# Patient Record
Sex: Female | Born: 1945
Health system: Southern US, Community
[De-identification: ages and names within clinical notes are randomized; demographics above are authoritative.]

## PROBLEM LIST (undated history)

## (undated) DIAGNOSIS — M1990 Unspecified osteoarthritis, unspecified site: Secondary | ICD-10-CM

## (undated) DIAGNOSIS — F17200 Nicotine dependence, unspecified, uncomplicated: Secondary | ICD-10-CM

## (undated) DIAGNOSIS — M40204 Unspecified kyphosis, thoracic region: Secondary | ICD-10-CM

## (undated) DIAGNOSIS — J439 Emphysema, unspecified: Secondary | ICD-10-CM

## (undated) DIAGNOSIS — S0990XA Unspecified injury of head, initial encounter: Secondary | ICD-10-CM

## (undated) DIAGNOSIS — E079 Disorder of thyroid, unspecified: Secondary | ICD-10-CM

## (undated) DIAGNOSIS — E785 Hyperlipidemia, unspecified: Secondary | ICD-10-CM

## (undated) DIAGNOSIS — M810 Age-related osteoporosis without current pathological fracture: Secondary | ICD-10-CM

## (undated) DIAGNOSIS — I10 Essential (primary) hypertension: Secondary | ICD-10-CM

## (undated) HISTORY — DX: Age-related osteoporosis without current pathological fracture: M81.0

## (undated) HISTORY — DX: Unspecified kyphosis, thoracic region: M40.204

## (undated) HISTORY — DX: Emphysema, unspecified: J43.9

## (undated) HISTORY — PX: OTHER SURGICAL HISTORY: SHX169

## (undated) HISTORY — DX: Unspecified injury of head, initial encounter: S09.90XA

## (undated) HISTORY — DX: Nicotine dependence, unspecified, uncomplicated: F17.200

## (undated) HISTORY — DX: Essential (primary) hypertension: I10

## (undated) HISTORY — DX: Unspecified osteoarthritis, unspecified site: M19.90

## (undated) HISTORY — PX: BARTHOLIN GLAND CYST EXCISION: SHX565

## (undated) HISTORY — DX: Hyperlipidemia, unspecified: E78.5

## (undated) HISTORY — DX: Disorder of thyroid, unspecified: E07.9

---

## 1982-04-18 DIAGNOSIS — S0990XA Unspecified injury of head, initial encounter: Secondary | ICD-10-CM

## 1982-04-18 HISTORY — DX: Unspecified injury of head, initial encounter: S09.90XA

## 2007-09-24 ENCOUNTER — Ambulatory Visit: Payer: Self-pay | Admitting: Family Medicine

## 2007-09-24 DIAGNOSIS — R350 Frequency of micturition: Secondary | ICD-10-CM

## 2007-09-24 DIAGNOSIS — I10 Essential (primary) hypertension: Secondary | ICD-10-CM

## 2007-09-24 LAB — CONVERTED CEMR LAB
Bilirubin Urine: NEGATIVE
Glucose, Urine, Semiquant: NEGATIVE
Urobilinogen, UA: 0.2

## 2007-09-25 ENCOUNTER — Encounter: Payer: Self-pay | Admitting: Family Medicine

## 2007-09-25 DIAGNOSIS — E785 Hyperlipidemia, unspecified: Secondary | ICD-10-CM

## 2007-09-25 LAB — CONVERTED CEMR LAB
ALT: 9 units/L (ref 0–35)
AST: 15 units/L (ref 0–37)
Alkaline Phosphatase: 89 units/L (ref 39–117)
BUN: 14 mg/dL (ref 6–23)
Cholesterol, target level: 200 mg/dL
Cholesterol: 269 mg/dL — ABNORMAL HIGH (ref 0–200)
Hemoglobin: 15.3 g/dL — ABNORMAL HIGH (ref 12.0–15.0)
LDL Cholesterol: 182 mg/dL — ABNORMAL HIGH (ref 0–99)
LDL Goal: 100 mg/dL
MCHC: 31.5 g/dL (ref 30.0–36.0)
Potassium: 3.9 meq/L (ref 3.5–5.3)
Sodium: 143 meq/L (ref 135–145)
TSH: 4.108 microintl units/mL (ref 0.350–5.50)
Total Bilirubin: 0.5 mg/dL (ref 0.3–1.2)
Total Protein: 7.5 g/dL (ref 6.0–8.3)
Triglycerides: 180 mg/dL — ABNORMAL HIGH (ref <150)
WBC: 10.3 10*3/uL (ref 4.0–10.5)

## 2007-12-31 ENCOUNTER — Ambulatory Visit: Payer: Self-pay | Admitting: Family Medicine

## 2007-12-31 LAB — CONVERTED CEMR LAB: LDL Goal: 130 mg/dL

## 2008-01-01 ENCOUNTER — Encounter: Payer: Self-pay | Admitting: Family Medicine

## 2008-01-01 LAB — CONVERTED CEMR LAB
Calcium: 10.7 mg/dL — ABNORMAL HIGH (ref 8.4–10.5)
Glucose, Bld: 96 mg/dL (ref 70–99)
Potassium: 4.1 meq/L (ref 3.5–5.3)
Sodium: 143 meq/L (ref 135–145)

## 2008-02-04 ENCOUNTER — Ambulatory Visit: Payer: Self-pay | Admitting: Family Medicine

## 2008-07-07 ENCOUNTER — Ambulatory Visit: Payer: Self-pay | Admitting: Family Medicine

## 2008-07-07 LAB — CONVERTED CEMR LAB: LDL Goal: 100 mg/dL

## 2008-09-17 ENCOUNTER — Ambulatory Visit: Payer: Self-pay | Admitting: Family Medicine

## 2008-09-17 DIAGNOSIS — F321 Major depressive disorder, single episode, moderate: Secondary | ICD-10-CM

## 2008-09-18 ENCOUNTER — Encounter: Payer: Self-pay | Admitting: Family Medicine

## 2008-09-18 LAB — CONVERTED CEMR LAB
ALT: 10 units/L (ref 0–35)
AST: 17 units/L (ref 0–37)
BUN: 13 mg/dL (ref 6–23)
Chloride: 107 meq/L (ref 96–112)
Creatinine, Ser: 0.73 mg/dL (ref 0.40–1.20)
Free T4: 0.9 ng/dL (ref 0.80–1.80)
HDL: 47 mg/dL (ref >39)
LDL Cholesterol: 147 mg/dL — ABNORMAL HIGH (ref 0–99)
Sodium: 143 meq/L (ref 135–145)
T3, Free: 3 pg/mL (ref 2.3–4.2)
Total Protein: 7.9 g/dL (ref 6.0–8.3)

## 2008-10-08 ENCOUNTER — Ambulatory Visit: Payer: Self-pay | Admitting: Family Medicine

## 2008-11-07 ENCOUNTER — Other Ambulatory Visit: Admission: RE | Admit: 2008-11-07 | Discharge: 2008-11-07 | Payer: Self-pay | Admitting: Family Medicine

## 2008-11-07 ENCOUNTER — Ambulatory Visit: Payer: Self-pay | Admitting: Family Medicine

## 2008-11-07 ENCOUNTER — Telehealth: Payer: Self-pay | Admitting: Family Medicine

## 2008-11-07 ENCOUNTER — Encounter: Payer: Self-pay | Admitting: Family Medicine

## 2008-11-10 LAB — CONVERTED CEMR LAB
Albumin: 4.8 g/dL (ref 3.5–5.2)
BUN: 10 mg/dL (ref 6–23)
CO2: 22 meq/L (ref 19–32)
Calcium: 10.1 mg/dL (ref 8.4–10.5)
Creatinine, Ser: 0.82 mg/dL (ref 0.40–1.20)
Glucose, Bld: 93 mg/dL (ref 70–99)
Potassium: 4.2 meq/L (ref 3.5–5.3)
TSH: 3.967 microintl units/mL (ref 0.350–4.500)
Triglycerides: 137 mg/dL (ref <150)
VLDL: 27 mg/dL (ref 0–40)
Vit D, 25-Hydroxy: 15 ng/mL — ABNORMAL LOW (ref 30–89)

## 2008-12-01 ENCOUNTER — Telehealth: Payer: Self-pay | Admitting: Family Medicine

## 2008-12-02 ENCOUNTER — Telehealth: Payer: Self-pay | Admitting: Family Medicine

## 2009-01-01 ENCOUNTER — Ambulatory Visit: Payer: Self-pay | Admitting: Family Medicine

## 2009-05-29 ENCOUNTER — Telehealth (INDEPENDENT_AMBULATORY_CARE_PROVIDER_SITE_OTHER): Payer: Self-pay | Admitting: *Deleted

## 2009-06-05 ENCOUNTER — Ambulatory Visit: Payer: Self-pay | Admitting: Family Medicine

## 2009-06-05 DIAGNOSIS — M25519 Pain in unspecified shoulder: Secondary | ICD-10-CM

## 2009-06-05 DIAGNOSIS — M25569 Pain in unspecified knee: Secondary | ICD-10-CM

## 2009-06-08 LAB — CONVERTED CEMR LAB
ALT: <8 units/L (ref 0–35)
AST: 16 units/L (ref 0–37)
Alkaline Phosphatase: 60 units/L (ref 39–117)
Cholesterol: 203 mg/dL — ABNORMAL HIGH (ref 0–200)
LDL Cholesterol: 111 mg/dL — ABNORMAL HIGH (ref 0–99)
Potassium: 4.2 meq/L (ref 3.5–5.3)
Sodium: 140 meq/L (ref 135–145)
TSH: 3.104 microintl units/mL (ref 0.350–4.500)
Total CHOL/HDL Ratio: 3.8
Total Protein: 7.5 g/dL (ref 6.0–8.3)
Triglycerides: 194 mg/dL — ABNORMAL HIGH (ref <150)
VLDL: 39 mg/dL (ref 0–40)

## 2009-06-23 ENCOUNTER — Encounter: Admission: RE | Admit: 2009-06-23 | Discharge: 2009-06-23 | Payer: Self-pay | Admitting: Family Medicine

## 2009-06-23 DIAGNOSIS — N959 Unspecified menopausal and perimenopausal disorder: Secondary | ICD-10-CM | POA: Insufficient documentation

## 2010-02-02 ENCOUNTER — Ambulatory Visit: Payer: Self-pay | Admitting: Family Medicine

## 2010-02-03 LAB — CONVERTED CEMR LAB
Creatinine, Ser: 0.99 mg/dL (ref 0.40–1.20)
Glucose, Bld: 102 mg/dL — ABNORMAL HIGH (ref 70–99)
HDL: 52 mg/dL (ref >39)
LDL Cholesterol: 127 mg/dL — ABNORMAL HIGH (ref 0–99)
Total CHOL/HDL Ratio: 3.9
VLDL: 23 mg/dL (ref 0–40)

## 2010-02-10 ENCOUNTER — Telehealth: Payer: Self-pay | Admitting: Family Medicine

## 2010-05-18 NOTE — Assessment & Plan Note (Signed)
Summary: HTN, shoulder pain, knee pain   Vital Signs:  Patient profile:   65 year old female Height:      60.5 inches Weight:      103 pounds BMI:     19.86 Pulse rate:   72 / minute BP sitting:   126 / 67  (left arm) Cuff size:   regular  Vitals Entered By: Kathlene November (June 05, 2009 10:00 AM) CC: checkup BP- pt states joints ache ? arthritis, Hypertension Management   Primary Care Provider:  Nani Gasser MD  CC:  checkup BP- pt states joints ache ? arthritis and Hypertension Management.  History of Present Illness: checkup BP- pt states joints ache ? arthritis.  Feels cold alot. Right knee, right hip, right ankle, left shoulder for about 1 year.  Somedays it gets worse.  Says can tell when the weather changes.  No neck pain.  shoulder pain occ radiates into her left shoulder blade. Takes her ASA once a day. Will occ use Tyelnol - that helps. No prior xrays on the shoulder.  Aggrevated by overuse.  Fell years ago down the steps and hit her shoulder. Went to ED and told it was just a contusion. Has NROM of the shoulder. Will occ get popful that is painful.  NO swelling of the joints.   Hypertension History:      She denies headache, chest pain, palpitations, dyspnea with exertion, orthopnea, PND, peripheral edema, visual symptoms, neurologic problems, syncope, and side effects from treatment.  She notes the following problems with antihypertensive medication side effects: Have an occ HA.  Occ dizzy if gets up or down to quickly. Marland Kitchen        Positive major cardiovascular risk factors include female age 59 years old or older, hyperlipidemia, hypertension, and current tobacco user.  Negative major cardiovascular risk factors include no history of diabetes and negative family history for ischemic heart disease.        Further assessment for target organ damage reveals no history of ASHD, stroke/TIA, or peripheral vascular disease.    Current Medications (verified): 1)   Lisinopril-Hydrochlorothiazide 20-12.5 Mg Tabs (Lisinopril-Hydrochlorothiazide) .... Take 1 Tablet By Mouth Once A Day 2)  Bayer Childrens Aspirin 81 Mg Chew (Aspirin) .... Take 1 Tablet By Mouth Once A Day 3)  Pravastatin Sodium 40 Mg Tabs (Pravastatin Sodium) .... Take Two Tablets By Mouth Once A Day At Bedtime  Allergies (verified): 1)  ! * Muscle Relaxer  Comments:  Nurse/Medical Assistant: The patient's medications and allergies were reviewed with the patient and were updated in the Medication and Allergy Lists. Kathlene November (June 05, 2009 10:01 AM)  Past History:  Social History: Last updated: 01/01/2009 Works at [employer].  Current Smoker Alcohol use-no Drug use-no Regular exercise-no  Physical Exam  General:  Well-developed,well-nourished,in no acute distress; alert,appropriate and cooperative throughout examination Head:  Normocephalic and atraumatic without obvious abnormalities. No apparent alopecia or balding. Neck:  No deformities, masses, or tenderness noted. No TM.  Lungs:  Normal respiratory effort, chest expands symmetrically. Lungs are clear to auscultation, no crackles or wheezes. Heart:  Normal rate and regular rhythm. S1 and S2 normal without gallop, murmur, click, rub or other extra sounds. Msk:  Left shoulder with decreased extension over her head compared to the right. Normal internal rotation and reach over.  Shoulder, elbow strength 5/5 bilat.  Right knee with crepitus and pain with full flexion. NO swelling over the knee.  Nontender over the shoulder. She  is mildly tender along the upper left thoracic paraspinous muscles. Nontender ove rthe rrighttrochanteric bursa.  Hip, knee, and ankle strength 5/5  Extremities:  No LE edema.  Neurologic:  UE and LE reflexes are symtric.  Skin:  no rashes.     Impression & Recommendations:  Problem # 1:  HYPERTENSION, BENIGN (ICD-401.1)  Looks good today.  Her updated medication list for this problem  includes:    Lisinopril-hydrochlorothiazide 20-12.5 Mg Tabs (Lisinopril-hydrochlorothiazide) .Marland Kitchen... Take 1 tablet by mouth once a day  BP today: 126/67 Prior BP: 112/68 (01/01/2009)  Prior 10 Yr Risk Heart Disease: 24 % (11/10/2008)  Labs Reviewed: K+: 4.2 (11/07/2008) Creat: : 0.82 (11/07/2008)   Chol: 184 (11/07/2008)   HDL: 42 (11/07/2008)   LDL: 115 (11/07/2008)   TG: 137 (11/07/2008)  Orders: T-Comprehensive Metabolic Panel ([id number]) T-TSH ([id number])  BP today: 126/67 Prior BP: 112/68 (01/01/2009)  10 Yr Risk Heart Disease: 17 % Prior 10 Yr Risk Heart Disease: 24 % (11/10/2008)  Labs Reviewed: K+: 4.2 (11/07/2008) Creat: : 0.82 (11/07/2008)   Chol: 184 (11/07/2008)   HDL: 42 (11/07/2008)   LDL: 115 (11/07/2008)   TG: 137 (11/07/2008)  Problem # 2:  SHOULDER PAIN, LEFT (ICD-719.41) Assessment: New Consider rotator cuff pathology.  For now will get xray to rule out OA and other abnormalities.  Did have a fall seveal years ago that may be contributing.  If OK will try home PT with exercise.  Can ice it as needed for now. Can use Tyelnol for pain control  Her updated medication list for this problem includes:    Bayer Childrens Aspirin 81 Mg Chew (Aspirin) .Marland Kitchen... Take 1 tablet by mouth once a day  Orders: T-DG Shoulder*L* (57322) T-DG Thoracic Spine 2 Views [phone])  Problem # 3:  KNEE PAIN, RIGHT (ICD-719.46) suspect OA. Will get xray as well. Use Tylenol as needed for pain relief.  Her updated medication list for this problem includes:    Bayer Childrens Aspirin 81 Mg Chew (Aspirin) .Marland Kitchen... Take 1 tablet by mouth once a day  Orders: T-DG Knee 2 Views*R* (70623)  Problem # 4:  HYPERLIPIDEMIA (ICD-272.4) Due oto recheck on the higher dose to make sure at goal.  Her updated medication list for this problem includes:    Pravastatin Sodium 40 Mg Tabs (Pravastatin sodium) .Marland Kitchen... Take two tablets by mouth once a day at bedtime  Orders: T-Lipid Profile  ([id number])  Complete Medication List: 1)  Lisinopril-hydrochlorothiazide 20-12.5 Mg Tabs (Lisinopril-hydrochlorothiazide) .... Take 1 tablet by mouth once a day 2)  Bayer Childrens Aspirin 81 Mg Chew (Aspirin) .... Take 1 tablet by mouth once a day 3)  Pravastatin Sodium 40 Mg Tabs (Pravastatin sodium) .... Take two tablets by mouth once a day at bedtime  Other Orders: T-Mammography Bilateral Screening (16073)  Hypertension Assessment/Plan:      The patient's hypertensive risk group is category B: At least one risk factor (excluding diabetes) with no target organ damage.  Her calculated 10 year risk of coronary heart disease is 17 %.  Today's blood pressure is 126/67.  Her blood pressure goal is < 140/90.

## 2010-05-18 NOTE — Progress Notes (Signed)
Summary: Change med ddose  Phone Note Call from Patient Call back at Home Phone [phone]   Caller: Patient Call For: Nani Gasser MD Summary of Call: Pt needs the Pravastatin 40mg  called in to take 2 a day- it cost 4.00. The 80mg  is too expensive. [facility] in [place] Initial call taken by: Kathlene November LPN,  February 10, 2010 10:00 AM  Follow-up for Phone Call        OK Follow-up by: Nani Gasser MD,  February 10, 2010 10:20 AM    New/Updated Medications: PRAVASTATIN SODIUM 40 MG TABS (PRAVASTATIN SODIUM) 2 tabs by mouth at bedtime. Prescriptions: PRAVASTATIN SODIUM 40 MG TABS (PRAVASTATIN SODIUM) 2 tabs by mouth at bedtime.  #180 x 1   Entered and Authorized by:   Nani Gasser MD   Signed by:   Nani Gasser MD on 02/10/2010   Method used:   Electronically to        [facility] [phone]* (retail)       [address] [place], [place]  [address]       Ph: [phone]       Fax: [phone]   RxID:   [id number]

## 2010-05-18 NOTE — Assessment & Plan Note (Signed)
Summary: f/u on BP, cholesterol  & meds   Vital Signs:  Patient profile:   65 year old female Height:      60.5 inches Weight:      104 pounds BP sitting:   117 / 68  (right arm) Cuff size:   regular  Vitals Entered By: Avon Gully CMA, Duncan Dull) (February 02, 2010 10:06 AM) CC: f/u bp and cholesterol(pt is only taking one pill a day of the pravastatin), Hypertension Management   Primary Care Provider:  Nani Gasser MD  CC:  f/u bp and cholesterol(pt is only taking one pill a day of the pravastatin) and Hypertension Management.  History of Present Illness: f/u bp and cholesterol(pt is only taking one pill a day of the pravastatin).  Says didnt feel wel when took 2 so dropped back down to one tab.  Has been watching what she is eating overall.    Hypertension History:      She denies headache, chest pain, palpitations, dyspnea with exertion, orthopnea, PND, peripheral edema, visual symptoms, neurologic problems, syncope, and side effects from treatment.  She notes no problems with any antihypertensive medication side effects.  Has occ dizzy spell when bends over and stands up but says thinkis it may be when she is not hugry. Marland Kitchen        Positive major cardiovascular risk factors include female age 35 years old or older, hyperlipidemia, hypertension, and current tobacco user.  Negative major cardiovascular risk factors include no history of diabetes and negative family history for ischemic heart disease.        Further assessment for target organ damage reveals no history of ASHD, stroke/TIA, or peripheral vascular disease.     Current Medications (verified): 1)  Lisinopril-Hydrochlorothiazide 20-12.5 Mg Tabs (Lisinopril-Hydrochlorothiazide) .... Take 1 Tablet By Mouth Once A Day 2)  Bayer Childrens Aspirin 81 Mg Chew (Aspirin) .... Take 1 Tablet By Mouth Once A Day 3)  Pravastatin Sodium 40 Mg Tabs (Pravastatin Sodium) .... Take Two Tablets By Mouth Once A Day At  Bedtime  Allergies (verified): 1)  ! * Muscle Relaxer  Comments:  Nurse/Medical Assistant: The patient's medications and allergies were reviewed with the patient and were updated in the Medication and Allergy Lists. Avon Gully CMA, Duncan Dull) (February 02, 2010 10:07 AM)  Physical Exam  General:  Well-developed,well-nourished,in no acute distress; alert,appropriate and cooperative throughout examination Lungs:  Normal respiratory effort, chest expands symmetrically. Lungs are clear to auscultation, no crackles or wheezes. Heart:  Normal rate and regular rhythm. S1 and S2 normal without gallop, murmur, click, rub or other extra sounds. Skin:  no rashes.   Cervical Nodes:  No lymphadenopathy noted Psych:  Cognition and judgment appear intact. Alert and cooperative with normal attention span and concentration. No apparent delusions, illusions, hallucinations   Impression & Recommendations:  Problem # 1:  HYPERTENSION, BENIGN (ICD-401.1) AT goal. Donig well.  Her updated medication list for this problem includes:    Lisinopril-hydrochlorothiazide 20-12.5 Mg Tabs (Lisinopril-hydrochlorothiazide) .Marland Kitchen... Take 1 tablet by mouth once a day  Orders: T-Comprehensive Metabolic Panel ([id number])  BP today: 117/68 Prior BP: 126/67 (06/05/2009)  10 Yr Risk Heart Disease: 8 % Prior 10 Yr Risk Heart Disease: 17 % (06/05/2009)  Labs Reviewed: K+: 4.2 (06/05/2009) Creat: : 0.81 (06/05/2009)   Chol: 203 (06/05/2009)   HDL: 53 (06/05/2009)   LDL: 111 (06/05/2009)   TG: 194 (06/05/2009)  Problem # 2:  HYPERLIPIDEMIA (ICD-272.4) Due to recheck suspect that on one tab, that she may  not be well controlled.  Her updated medication list for this problem includes:    Pravastatin Sodium 40 Mg Tabs (Pravastatin sodium) .Marland Kitchen... Take two tablets by mouth once a day at bedtime  Orders: T-Lipid Profile ([id number])  Problem # 3:  SHOULDER PAIN, LEFT (ICD-719.41) REviewed xray results wtih him. Call  if sxs return and wil refer for PT>  Her updated medication list for this problem includes:    Bayer Childrens Aspirin 81 Mg Chew (Aspirin) .Marland Kitchen... Take 1 tablet by mouth once a day  Complete Medication List: 1)  Lisinopril-hydrochlorothiazide 20-12.5 Mg Tabs (Lisinopril-hydrochlorothiazide) .... Take 1 tablet by mouth once a day 2)  Bayer Childrens Aspirin 81 Mg Chew (Aspirin) .... Take 1 tablet by mouth once a day 3)  Pravastatin Sodium 40 Mg Tabs (Pravastatin sodium) .... Take two tablets by mouth once a day at bedtime  Other Orders: T-Dual DXA Bone Density/ Axial (42706) Pneumococcal Vaccine (23762) Admin 1st Vaccine (83151)  Hypertension Assessment/Plan:      The patient's hypertensive risk group is category B: At least one risk factor (excluding diabetes) with no target organ damage.  Her calculated 10 year risk of coronary heart disease is 8 %.  Today's blood pressure is 117/68.  Her blood pressure goal is < 140/90.  Contraindications/Deferment of Procedures/Staging:    Test/Procedure: FLU VAX    Reason for deferment: patient declined   Patient Instructions: 1)  Call [phone] to schedule your bone density test. Ask for the [facility] location.  2)  Please schedule a follow-up appointment in 6 months for blood pressure.    Orders Added: 1)  T-Lipid Profile [[id number]] 2)  T-Comprehensive Metabolic Panel [[id number]] 3)  T-Dual DXA Bone Density/ Axial [77080] 4)  Pneumococcal Vaccine [90732] 5)  Admin 1st Vaccine [90471] 6)  Est. Patient Level III [76160]   Immunizations Administered:  Pneumonia Vaccine:    Vaccine Type: Pneumovax (Medicare)    Site: left deltoid    Mfr: Merck    Dose: 0.5 ml    Route: IM    Given by: Sue Lush McCrimmon CMA, (AAMA)    Exp. Date: 07/01/2011    Lot #: [id number]    VIS given: 03/23/09 version given February 02, 2010.   Immunizations Administered:  Pneumonia Vaccine:    Vaccine Type: Pneumovax (Medicare)    Site: left deltoid     Mfr: Merck    Dose: 0.5 ml    Route: IM    Given by: Sue Lush McCrimmon CMA, (AAMA)    Exp. Date: 07/01/2011    Lot #: [id number]    VIS given: 03/23/09 version given February 02, 2010.

## 2010-05-18 NOTE — Progress Notes (Signed)
Summary: refill  Phone Note Call from Patient Call back at Home Phone [phone]   Caller: Patient----walk in Reason for Call: Refill Medication Summary of Call: Rf Lisonopril at [facility] in [place]. Patient will make ov soon. Initial call taken by: Warnell Forester,  May 29, 2009 1:16 PM    Prescriptions: LISINOPRIL-HYDROCHLOROTHIAZIDE 20-12.5 MG TABS (LISINOPRIL-HYDROCHLOROTHIAZIDE) Take 1 tablet by mouth once a day  #30 Each x 0   Entered by:   Payton Spark CMA   Authorized by:   Nani Gasser MD   Signed by:   Payton Spark CMA on 05/29/2009   Method used:   Electronically to        [facility] [phone]* (retail)       [address] [place], [place]  [address]       Ph: [phone]       Fax: [phone]   RxID:   [id number]

## 2010-08-09 ENCOUNTER — Other Ambulatory Visit: Payer: Self-pay | Admitting: Family Medicine

## 2010-10-05 ENCOUNTER — Other Ambulatory Visit: Payer: Self-pay | Admitting: Family Medicine

## 2011-02-04 ENCOUNTER — Other Ambulatory Visit: Payer: Self-pay | Admitting: Family Medicine

## 2011-02-13 ENCOUNTER — Encounter: Payer: Self-pay | Admitting: Family Medicine

## 2011-02-17 ENCOUNTER — Encounter: Payer: Self-pay | Admitting: Family Medicine

## 2011-02-17 ENCOUNTER — Ambulatory Visit (INDEPENDENT_AMBULATORY_CARE_PROVIDER_SITE_OTHER): Payer: No Typology Code available for payment source | Admitting: Family Medicine

## 2011-02-17 VITALS — BP 125/75 | HR 71 | Wt 97.0 lb

## 2011-02-17 DIAGNOSIS — I10 Essential (primary) hypertension: Secondary | ICD-10-CM

## 2011-02-17 DIAGNOSIS — Z780 Asymptomatic menopausal state: Secondary | ICD-10-CM

## 2011-02-17 DIAGNOSIS — R0789 Other chest pain: Secondary | ICD-10-CM

## 2011-02-17 DIAGNOSIS — Z1231 Encounter for screening mammogram for malignant neoplasm of breast: Secondary | ICD-10-CM

## 2011-02-17 NOTE — Progress Notes (Signed)
  Subjective:    Patient ID: Janet Aguirre, female    DOB: [birth year], 65 y.o.   MRN: [id number]  Hypertension This is a chronic problem. The current episode started more than 1 year ago. The problem is unchanged. The problem is controlled. Pertinent negatives include no blurred vision, chest pain or shortness of breath. There are no associated agents to hypertension. Past treatments include diuretics and ACE inhibitors. The current treatment provides moderate improvement. There are no compliance problems.    Says if she gets upset or stress will get pain behind her left shoulder and then as she calms down it eases off. No prior hx of heart dz but does have hx fo HTN. We have done xrays of that shoulder in the past. No SOB or diaphoresis with the pain.   Review of Systems  Eyes: Negative for blurred vision.  Respiratory: Negative for shortness of breath.   Cardiovascular: Negative for chest pain.       Objective:   Physical Exam  Constitutional: She is oriented to person, place, and time. She appears well-developed and well-nourished.  HENT:  Head: Normocephalic and atraumatic.  Eyes: Conjunctivae are normal. Pupils are equal, round, and reactive to light.  Neck: Neck supple. No thyromegaly present.  Cardiovascular: Normal rate, regular rhythm and normal heart sounds.   Pulmonary/Chest: Effort normal and breath sounds normal.  Abdominal: Soft. Bowel sounds are normal.  Musculoskeletal:       Kyphosis.   Lymphadenopathy:    She has no cervical adenopathy.  Neurological: She is alert and oriented to person, place, and time.  Skin: Skin is warm and dry.  Psychiatric: She has a normal mood and affect. Her behavior is normal.          Assessment & Plan:  HTN- Well controlled. F.U in 6 mo. Call if any concners. Continue current regimen. Lab slip given   Left shoulder pain _ hurts when gets stressed or anxious. With her hx of HTN and hyperlipidemia  I rec an EKG to her.  EKG show  rate of 66 bpm, No acute changes, NSR. Gave reassurance. Call if gets worse or actually radiates into the chest.   She is ready to schedule her bone density adn mammo.

## 2011-02-17 NOTE — Patient Instructions (Signed)
We will call you once we get your labs and mammogram and bone density results.

## 2011-02-18 ENCOUNTER — Other Ambulatory Visit: Payer: Self-pay | Admitting: Family Medicine

## 2011-02-18 ENCOUNTER — Telehealth: Payer: Self-pay | Admitting: *Deleted

## 2011-02-18 LAB — COMPLETE METABOLIC PANEL WITH GFR
ALT: 11 U/L (ref 0–35)
AST: 15 U/L (ref 0–37)
Albumin: 4.8 g/dL (ref 3.5–5.2)
Alkaline Phosphatase: 55 U/L (ref 39–117)
CO2: 28 mEq/L (ref 19–32)
GFR, Est Non African American: 57 mL/min — ABNORMAL LOW (ref >90)
Glucose, Bld: 95 mg/dL (ref 70–99)
Potassium: 4.3 mEq/L (ref 3.5–5.3)
Sodium: 141 mEq/L (ref 135–145)

## 2011-02-18 LAB — LIPID PANEL
LDL Cholesterol: 110 mg/dL — ABNORMAL HIGH (ref 0–99)
Total CHOL/HDL Ratio: 3.7 Ratio

## 2011-02-18 MED ORDER — ATORVASTATIN CALCIUM 80 MG PO TABS: 80.0000 mg | ORAL_TABLET | Freq: Every day | ORAL | Status: DC

## 2011-02-18 MED ORDER — PRAVASTATIN SODIUM 40 MG PO TABS: 60.0000 mg | ORAL_TABLET | Freq: Every day | ORAL | Status: DC

## 2011-02-18 NOTE — Telephone Encounter (Signed)
Pt.notified

## 2011-02-18 NOTE — Telephone Encounter (Signed)
Message copied by Wyline Beady on Fri Feb 18, 2011  2:55 PM ------      Message from: Nani Gasser D      Created: Fri Feb 18, 2011 11:48 AM       Sorry, since already taking 2 tabs then will need to change to a stronger med like lipitor (now comes generic). Will send over new med and then check labs in 6 mo.

## 2011-05-06 ENCOUNTER — Other Ambulatory Visit: Payer: Self-pay | Admitting: Family Medicine

## 2011-06-06 ENCOUNTER — Other Ambulatory Visit: Payer: Self-pay | Admitting: Family Medicine

## 2011-09-04 ENCOUNTER — Other Ambulatory Visit: Payer: Self-pay | Admitting: Family Medicine

## 2011-10-06 ENCOUNTER — Other Ambulatory Visit: Payer: Self-pay | Admitting: Family Medicine

## 2011-11-03 ENCOUNTER — Other Ambulatory Visit: Payer: Self-pay | Admitting: Family Medicine

## 2011-11-15 ENCOUNTER — Ambulatory Visit (INDEPENDENT_AMBULATORY_CARE_PROVIDER_SITE_OTHER): Payer: Medicare Other

## 2011-11-15 ENCOUNTER — Encounter: Payer: Self-pay | Admitting: Family Medicine

## 2011-11-15 ENCOUNTER — Other Ambulatory Visit: Payer: Self-pay | Admitting: Family Medicine

## 2011-11-15 ENCOUNTER — Ambulatory Visit (INDEPENDENT_AMBULATORY_CARE_PROVIDER_SITE_OTHER): Payer: Medicare Other | Admitting: Family Medicine

## 2011-11-15 VITALS — BP 124/80 | HR 70 | Wt 94.0 lb

## 2011-11-15 DIAGNOSIS — Z780 Asymptomatic menopausal state: Secondary | ICD-10-CM

## 2011-11-15 DIAGNOSIS — E785 Hyperlipidemia, unspecified: Secondary | ICD-10-CM

## 2011-11-15 DIAGNOSIS — I10 Essential (primary) hypertension: Secondary | ICD-10-CM

## 2011-11-15 DIAGNOSIS — M810 Age-related osteoporosis without current pathological fracture: Secondary | ICD-10-CM

## 2011-11-15 DIAGNOSIS — R630 Anorexia: Secondary | ICD-10-CM

## 2011-11-15 DIAGNOSIS — F17200 Nicotine dependence, unspecified, uncomplicated: Secondary | ICD-10-CM

## 2011-11-15 DIAGNOSIS — N959 Unspecified menopausal and perimenopausal disorder: Secondary | ICD-10-CM

## 2011-11-15 DIAGNOSIS — Z720 Tobacco use: Secondary | ICD-10-CM

## 2011-11-15 DIAGNOSIS — R634 Abnormal weight loss: Secondary | ICD-10-CM

## 2011-11-15 LAB — COMPLETE METABOLIC PANEL WITH GFR
ALT: 10 U/L (ref 0–35)
Albumin: 5 g/dL (ref 3.5–5.2)
BUN: 26 mg/dL — ABNORMAL HIGH (ref 6–23)
CO2: 26 mEq/L (ref 19–32)
Calcium: 10.6 mg/dL — ABNORMAL HIGH (ref 8.4–10.5)
Chloride: 103 mEq/L (ref 96–112)
Potassium: 4.3 mEq/L (ref 3.5–5.3)
Sodium: 140 mEq/L (ref 135–145)
Total Protein: 7.5 g/dL (ref 6.0–8.3)

## 2011-11-15 LAB — CBC WITH DIFFERENTIAL/PLATELET
Basophils Absolute: 0.1 10*3/uL (ref 0.0–0.1)
Eosinophils Absolute: 0.3 10*3/uL (ref 0.0–0.7)
HCT: 42.3 % (ref 36.0–46.0)
Hemoglobin: 14.5 g/dL (ref 12.0–15.0)
Lymphocytes Relative: 21 % (ref 12–46)
Lymphs Abs: 2.3 10*3/uL (ref 0.7–4.0)
MCH: 30 pg (ref 26.0–34.0)
MCHC: 34.3 g/dL (ref 30.0–36.0)
MCV: 87.6 fL (ref 78.0–100.0)
Monocytes Absolute: 0.7 10*3/uL (ref 0.1–1.0)
Neutro Abs: 7.4 10*3/uL (ref 1.7–7.7)
Platelets: 480 10*3/uL — ABNORMAL HIGH (ref 150–400)
RDW: 13.8 % (ref 11.5–15.5)

## 2011-11-15 LAB — LIPID PANEL
HDL: 48 mg/dL (ref >39)
LDL Cholesterol: 116 mg/dL — ABNORMAL HIGH (ref 0–99)

## 2011-11-15 MED ORDER — ALENDRONATE SODIUM 70 MG PO TABS: 70.0000 mg | ORAL_TABLET | ORAL | Status: DC

## 2011-11-15 NOTE — Patient Instructions (Addendum)
Try taking the Prilosec for 2 weeks and see if feel any better. Call if you are not better in 2 weeks. I will refer you to GI if you are not better.

## 2011-11-15 NOTE — Progress Notes (Signed)
  Subjective:    Patient ID: Janet Aguirre, female    DOB: 1946/02/20, 66 y.o.   MRN: [id number]  HPI HTN - Last seen in November for BP.  No CP ro SOB. Taking her medication.  Has been having dizzy spells. Says happen when she doesn't eat. No dysphagia. No appetite. No energy.  Occ will have a stomach cramp after eating certain foods.  Staying away from fried foods.  Never had a colonoscopy.  She drinks a lot of coffee. Still takes a baby aspirin. No heartburn. Has lost some weight.   Hyperlpidemia - Taking her meds regularly. No SE.      Review of Systems     Objective:   Physical Exam  Constitutional: She is oriented to person, place, and time. She appears well-developed and well-nourished.  HENT:  Head: Normocephalic and atraumatic.  Mouth/Throat: Oropharynx is clear and moist.  Neck: Neck supple. No thyromegaly present.  Cardiovascular: Normal rate, regular rhythm and normal heart sounds.        No carotid or abodminal bruits.   Pulmonary/Chest: Effort normal and breath sounds normal.  Abdominal: Soft. Bowel sounds are normal. She exhibits no distension and no mass. There is no tenderness. There is no rebound and no guarding.  Musculoskeletal: She exhibits no edema.  Lymphadenopathy:    She has no cervical adenopathy.  Neurological: She is alert and oriented to person, place, and time.  Skin: Skin is warm and dry.  Psychiatric: She has a normal mood and affect. Her behavior is normal.          Assessment & Plan:  HTN - Well controlled. F/U in 6 months. Due for labwork, CMP.    Decreased appetite - Unclear etiology. Encourage her to eat more. Try taking the Prilosec for 2 weeks and see if feel any better. She certainly drinks a lot of coffee and smokes and eats little. That is a lot of exposure to GI irritants. Discussed that cigarettes can also work as appetite suppressants. In that quitting smoking or at least cutting back be helpful.  Dizziness - Unclear etiology. BP  well controlled. ON her chol and Aspirin daily.  Will check for anemia and thyroid problems and electrolyte disturbance.  Most Likley hypglycemic events since it seems mostly happen when she hasn't eaten. Eval for diabetes.  Orthostatics performed and were normal today.   Hyperlipidemia  - Due for lipid panel and liver enzymes.   Due for bone density. She never went. Wants to hold off on mammo for now. New Order placed.

## 2011-11-16 ENCOUNTER — Other Ambulatory Visit: Payer: Self-pay | Admitting: Family Medicine

## 2011-11-16 ENCOUNTER — Encounter: Payer: Self-pay | Admitting: *Deleted

## 2011-11-16 MED ORDER — PRAVASTATIN SODIUM 80 MG PO TABS: 80.0000 mg | ORAL_TABLET | Freq: Every day | ORAL | Status: DC

## 2011-11-16 MED ORDER — LISINOPRIL 40 MG PO TABS: 40.0000 mg | ORAL_TABLET | Freq: Every day | ORAL | Status: DC

## 2011-12-06 ENCOUNTER — Encounter: Payer: Self-pay | Admitting: Obstetrics & Gynecology

## 2011-12-06 ENCOUNTER — Ambulatory Visit (INDEPENDENT_AMBULATORY_CARE_PROVIDER_SITE_OTHER): Payer: Medicare Other | Admitting: Obstetrics & Gynecology

## 2011-12-06 VITALS — BP 136/80 | HR 79 | Temp 98.2°F | Resp 16 | Ht 61.0 in | Wt 94.0 lb

## 2011-12-06 DIAGNOSIS — L723 Sebaceous cyst: Secondary | ICD-10-CM

## 2011-12-06 NOTE — Progress Notes (Signed)
  Subjective:    Patient ID: MONTINE HIGHT, female    DOB: 01/22/1946, 66 y.o.   MRN: [id number]  HPI  66 yo SW lady who is here today with the complaint of at least a 1 year h/o periodic "bumps" on her vulva. She has been abstinent for the last 2 years. Her pap smear is reportedly UTD as is her mammogram.  Review of Systems     Objective:   Physical Exam   Small sebaceous cyst on right labium majus. Normal vagina and cervix and bimanual exam     Assessment & Plan:  Sebaceous cyst- reassurance given RTC prn

## 2012-04-02 ENCOUNTER — Other Ambulatory Visit: Payer: Self-pay | Admitting: Family Medicine

## 2012-07-09 ENCOUNTER — Other Ambulatory Visit: Payer: Self-pay | Admitting: Family Medicine

## 2012-08-07 ENCOUNTER — Other Ambulatory Visit: Payer: Self-pay | Admitting: Family Medicine

## 2012-08-10 ENCOUNTER — Other Ambulatory Visit: Payer: Self-pay | Admitting: *Deleted

## 2012-08-10 MED ORDER — LISINOPRIL 40 MG PO TABS: 40.0000 mg | ORAL_TABLET | Freq: Every day | ORAL | Status: DC

## 2012-08-17 ENCOUNTER — Encounter: Payer: Self-pay | Admitting: Family Medicine

## 2012-08-17 ENCOUNTER — Ambulatory Visit (INDEPENDENT_AMBULATORY_CARE_PROVIDER_SITE_OTHER): Payer: Medicare Other | Admitting: Family Medicine

## 2012-08-17 VITALS — BP 170/94 | HR 83 | Ht 61.6 in | Wt 94.0 lb

## 2012-08-17 DIAGNOSIS — E785 Hyperlipidemia, unspecified: Secondary | ICD-10-CM

## 2012-08-17 DIAGNOSIS — M810 Age-related osteoporosis without current pathological fracture: Secondary | ICD-10-CM

## 2012-08-17 DIAGNOSIS — F17200 Nicotine dependence, unspecified, uncomplicated: Secondary | ICD-10-CM

## 2012-08-17 DIAGNOSIS — Z720 Tobacco use: Secondary | ICD-10-CM

## 2012-08-17 DIAGNOSIS — R350 Frequency of micturition: Secondary | ICD-10-CM

## 2012-08-17 DIAGNOSIS — R3915 Urgency of urination: Secondary | ICD-10-CM

## 2012-08-17 DIAGNOSIS — E559 Vitamin D deficiency, unspecified: Secondary | ICD-10-CM

## 2012-08-17 DIAGNOSIS — I10 Essential (primary) hypertension: Secondary | ICD-10-CM

## 2012-08-17 LAB — POCT URINALYSIS DIPSTICK
Blood, UA: NEGATIVE
Glucose, UA: NEGATIVE
Nitrite, UA: NEGATIVE
Urobilinogen, UA: 0.2

## 2012-08-17 MED ORDER — ALENDRONATE SODIUM 70 MG PO TABS: 70.0000 mg | ORAL_TABLET | ORAL | Status: AC

## 2012-08-17 MED ORDER — LOVASTATIN 20 MG PO TABS: 20.0000 mg | ORAL_TABLET | Freq: Every day | ORAL | Status: DC

## 2012-08-17 MED ORDER — LISINOPRIL 40 MG PO TABS: 40.0000 mg | ORAL_TABLET | Freq: Every day | ORAL | Status: DC

## 2012-08-17 MED ORDER — AMLODIPINE BESYLATE 2.5 MG PO TABS: 2.5000 mg | ORAL_TABLET | Freq: Every day | ORAL | Status: DC

## 2012-08-17 NOTE — Patient Instructions (Addendum)
I am adding amlodipine 2.5 mg once a day to your blood pressure regimen. He will continue to take the lisinopril too. Followup in one month so that we can recheck her pressure make sure that you're getting to your goal. We will call you with your lab results once they are available. Let me know if you have any problems with the lovastatin, cholesterol pill or the alendronate, osteoporosis pill.

## 2012-08-17 NOTE — Progress Notes (Signed)
  Subjective:    Patient ID: Janet Aguirre, female    DOB: [birth year], 67 y.o.   MRN: [id number]  HPI HTN -  Pt denies chest pain, SOB, dizziness, or heart palpitations.  Taking meds as directed w/o problems.  Denies medication side effects.  We have stopped her hydrochlorothiazide back in July because her BUN/creatinine were elevated. She was supposed to followup 2 weeks after that and never did. She is now just on lisinopril 40 mg tolerating it well without any side effects.  Hyperlipidemia - Says when rean out of her pravastatin didn't refill it bc of muscle aches. She has never tried any other statins.  Osteoporosis - She never started the bisphosphonate. She says she never picked up she really doesn't want to take extra medication so she doesn't need them. She continues to smoke. She is a thin white female.  She would like me to check her urine today. She says sometimes she feels like she urinates too frequently and too often and other times she gets the urge to go but only dribbles. This is been going on for months. No hematuria. No pelvic pain.  Review of Systems     Objective:   Physical Exam  Constitutional: She is oriented to person, place, and time. She appears well-developed and well-nourished.  HENT:  Head: Normocephalic and atraumatic.  Cardiovascular: Normal rate, regular rhythm and normal heart sounds.   Pulmonary/Chest: Effort normal and breath sounds normal.  Neurological: She is alert and oriented to person, place, and time.  Skin: Skin is warm and dry.  Psychiatric: She has a normal mood and affect. Her behavior is normal.          Assessment & Plan:  HTN- Uncontrolled. Will recheck BP.  Repeat pressure at the end of the visit was still quite elevated. Will add amlodipine 2.5 mg and she did not tolerate Hydrocort Dyazide in the past. She's to continue the lisinopril as well. Followup in one month to recheck blood pressure.  Hyperlipidemia- Will change to  lovastatin. Strongly encouraged her to consider being on a statin. I explained her that because she had myalgias with one doesn't mean that she will have it with all of them but is still a potential. I talked her into at least trying to lovastatin which is now on the four-hour list at [facility].  Osteoporosis - Discussed getting adequat calcium and vit d in addition ot using a bisphosphonate. She says she is ok to try it but will need new rx. Encourage regular exercise as well. She does have some kyphosis but this has been since childhood.  Tobacco abuse-encourage smoking cessation.  Vit D deficiency - Due to recheck levels.   Urinary urgency-urinalysis performed.

## 2012-08-18 LAB — LIPID PANEL
LDL Cholesterol: 171 mg/dL — ABNORMAL HIGH (ref 0–99)
Triglycerides: 114 mg/dL (ref <150)
VLDL: 23 mg/dL (ref 0–40)

## 2012-08-18 LAB — COMPLETE METABOLIC PANEL WITH GFR
ALT: <8 U/L (ref 0–35)
AST: 15 U/L (ref 0–37)
Albumin: 4.7 g/dL (ref 3.5–5.2)
CO2: 29 mEq/L (ref 19–32)
Calcium: 10.5 mg/dL (ref 8.4–10.5)
Chloride: 104 mEq/L (ref 96–112)
Potassium: 4.1 mEq/L (ref 3.5–5.3)
Sodium: 142 mEq/L (ref 135–145)
Total Protein: 7.5 g/dL (ref 6.0–8.3)

## 2012-08-18 LAB — VITAMIN D 25 HYDROXY (VIT D DEFICIENCY, FRACTURES): Vit D, 25-Hydroxy: 49 ng/mL (ref 30–89)

## 2012-09-17 ENCOUNTER — Encounter: Payer: Self-pay | Admitting: Family Medicine

## 2012-09-17 ENCOUNTER — Ambulatory Visit (INDEPENDENT_AMBULATORY_CARE_PROVIDER_SITE_OTHER): Payer: Medicare Other | Admitting: Family Medicine

## 2012-09-17 VITALS — BP 120/78 | HR 68 | Temp 97.2°F | Wt 94.0 lb

## 2012-09-17 DIAGNOSIS — I10 Essential (primary) hypertension: Secondary | ICD-10-CM

## 2012-09-17 DIAGNOSIS — J019 Acute sinusitis, unspecified: Secondary | ICD-10-CM

## 2012-09-17 DIAGNOSIS — E785 Hyperlipidemia, unspecified: Secondary | ICD-10-CM

## 2012-09-17 NOTE — Progress Notes (Signed)
  Subjective:    Patient ID: Janet Aguirre, female    DOB: September 20, 1945, 67 y.o.   MRN: [id number]  HPI HTN - Pt denies chest pain, SOB, dizziness, or heart palpitations.  Taking meds as directed w/o problems.  Denies medication side effects.   Lipid Panel     Component Value Date/Time   CHOL 256* 08/17/2012 1012   TRIG 114 08/17/2012 1012   HDL 62 08/17/2012 1012   CHOLHDL 4.1 08/17/2012 1012   VLDL 23 08/17/2012 1012   LDLCALC 171* 08/17/2012 1012      Sinus pressure and congestion x 3 days.  No fever. No ST.  Clear drianage.  +post nasal drip. + smoker. + cough.   Taking alkaseltzer cold plus. Call if not better by the end of the week.  + sick contact.   Hyperlipidemia-tolerating her statin well without any side effects or problems. She did bring the box and work with her today. Make sure eating healthy and getting regular exercise as well.  Tob abuse - Discussed need for cessation.    Review of Systems     Objective:   Physical Exam  Constitutional: She is oriented to person, place, and time. She appears well-developed and well-nourished.  HENT:  Head: Normocephalic and atraumatic.  Right Ear: External ear normal.  Left Ear: External ear normal.  Nose: Nose normal.  Mouth/Throat: Oropharynx is clear and moist.  TMs and canals are clear.   Eyes: Conjunctivae and EOM are normal. Pupils are equal, round, and reactive to light.  Neck: Neck supple. No thyromegaly present.  Cardiovascular: Normal rate, regular rhythm and normal heart sounds.   Pulmonary/Chest: Effort normal and breath sounds normal. She has no wheezes.  Lymphadenopathy:    She has no cervical adenopathy.  Neurological: She is alert and oriented to person, place, and time.  Skin: Skin is warm and dry.  Psychiatric: She has a normal mood and affect. Her behavior is normal.          Assessment & Plan:  HTN- Well controlled.  F/U in 6 months.    Sinusitis - Likely viral. Gave reassurnce. Call if not better by the  end of the week. The she can use the over-the-counter cough medicine that she is currently using. Call if suddenly gets worse or if just not better by the end of the week before she leaves town.  Declined mammo today.  Declined tetanus vaccine today.  Hyper lipidemia-tolerating statin well. Continue current regimen. Repeat lipids in 6 months.

## 2012-09-17 NOTE — Patient Instructions (Signed)

## 2012-09-24 ENCOUNTER — Telehealth: Payer: Self-pay | Admitting: *Deleted

## 2012-09-24 MED ORDER — AMOXICILLIN-POT CLAVULANATE 875-125 MG PO TABS: 1.0000 | ORAL_TABLET | Freq: Two times a day (BID) | ORAL | Status: DC

## 2012-09-24 NOTE — Telephone Encounter (Signed)
I will call him in about her pharmacy. If she's not better by the time she completes the course of antibiotics and did come back into see me.

## 2012-09-24 NOTE — Telephone Encounter (Signed)
Pt comes by office and states you told her to let you know if she was not feeling better by last Thursday- pt states she is not still has a lot of head and nasal congestion, some productive cough, not much H/A

## 2012-09-25 NOTE — Telephone Encounter (Signed)
Pt notified med sent to pharmacy via VM. Instructed if no better when completes med to make appt. Barry Dienes, LPN

## 2012-11-18 ENCOUNTER — Other Ambulatory Visit: Payer: Self-pay | Admitting: Family Medicine

## 2012-11-19 ENCOUNTER — Other Ambulatory Visit: Payer: Self-pay | Admitting: *Deleted

## 2012-11-19 MED ORDER — AMLODIPINE BESYLATE 2.5 MG PO TABS: ORAL_TABLET | ORAL | Status: DC

## 2012-11-19 MED ORDER — LISINOPRIL 40 MG PO TABS: ORAL_TABLET | ORAL | Status: DC

## 2012-11-30 ENCOUNTER — Other Ambulatory Visit: Payer: Self-pay | Admitting: Family Medicine

## 2013-02-19 ENCOUNTER — Other Ambulatory Visit: Payer: Self-pay | Admitting: Family Medicine

## 2013-03-25 ENCOUNTER — Other Ambulatory Visit: Payer: Self-pay | Admitting: Family Medicine

## 2013-04-23 ENCOUNTER — Telehealth: Payer: Self-pay | Admitting: *Deleted

## 2013-04-23 ENCOUNTER — Other Ambulatory Visit: Payer: Self-pay | Admitting: *Deleted

## 2013-04-23 MED ORDER — AMLODIPINE BESYLATE 2.5 MG PO TABS: ORAL_TABLET | ORAL | Status: DC

## 2013-04-23 NOTE — Telephone Encounter (Signed)
Spoke with pt & informed her that she needs a hypertension f/u appt within the next 30 days.  Refilled amlodipine #30 no refills. Pt voiced understanding.

## 2013-05-22 ENCOUNTER — Encounter: Payer: Self-pay | Admitting: Family Medicine

## 2013-05-22 ENCOUNTER — Ambulatory Visit (INDEPENDENT_AMBULATORY_CARE_PROVIDER_SITE_OTHER): Payer: Medicare Other | Admitting: Family Medicine

## 2013-05-22 VITALS — BP 164/77 | HR 73 | Temp 98.4°F | Resp 18 | Ht 60.5 in | Wt 93.0 lb

## 2013-05-22 DIAGNOSIS — M1990 Unspecified osteoarthritis, unspecified site: Secondary | ICD-10-CM

## 2013-05-22 DIAGNOSIS — F17200 Nicotine dependence, unspecified, uncomplicated: Secondary | ICD-10-CM

## 2013-05-22 DIAGNOSIS — E785 Hyperlipidemia, unspecified: Secondary | ICD-10-CM

## 2013-05-22 DIAGNOSIS — I10 Essential (primary) hypertension: Secondary | ICD-10-CM

## 2013-05-22 DIAGNOSIS — M810 Age-related osteoporosis without current pathological fracture: Secondary | ICD-10-CM

## 2013-05-22 DIAGNOSIS — Z720 Tobacco use: Secondary | ICD-10-CM

## 2013-05-22 LAB — COMPREHENSIVE METABOLIC PANEL
ALT: 12 U/L (ref 0–35)
AST: 19 U/L (ref 0–37)
Albumin: 4.7 g/dL (ref 3.5–5.2)
Alkaline Phosphatase: 49 U/L (ref 39–117)
BUN: 19 mg/dL (ref 6–23)
CALCIUM: 11 mg/dL — AB (ref 8.4–10.5)
CO2: 28 meq/L (ref 19–32)
Chloride: 102 mEq/L (ref 96–112)
Creatinine, Ser: 1 mg/dL (ref 0.4–1.2)
GFR: 59.76 mL/min — AB (ref >60.00)
Glucose, Bld: 83 mg/dL (ref 70–99)
Potassium: 4.3 mEq/L (ref 3.5–5.1)
SODIUM: 142 meq/L (ref 135–145)
TOTAL PROTEIN: 8.1 g/dL (ref 6.0–8.3)
Total Bilirubin: 0.5 mg/dL (ref 0.3–1.2)

## 2013-05-22 LAB — LDL CHOLESTEROL, DIRECT: Direct LDL: 194.5 mg/dL

## 2013-05-22 LAB — CBC WITH DIFFERENTIAL/PLATELET
BASOS ABS: 0 10*3/uL (ref 0.0–0.1)
Basophils Relative: 0.3 % (ref 0.0–3.0)
Eosinophils Absolute: 0.4 10*3/uL (ref 0.0–0.7)
Eosinophils Relative: 2.6 % (ref 0.0–5.0)
HCT: 47.8 % — ABNORMAL HIGH (ref 36.0–46.0)
Hemoglobin: 15.4 g/dL — ABNORMAL HIGH (ref 12.0–15.0)
LYMPHS PCT: 12.9 % (ref 12.0–46.0)
Lymphs Abs: 1.9 10*3/uL (ref 0.7–4.0)
MCHC: 32.3 g/dL (ref 30.0–36.0)
MCV: 92.6 fl (ref 78.0–100.0)
MONOS PCT: 3.2 % (ref 3.0–12.0)
Monocytes Absolute: 0.5 10*3/uL (ref 0.1–1.0)
Neutro Abs: 12.1 10*3/uL — ABNORMAL HIGH (ref 1.4–7.7)
Neutrophils Relative %: 81 % — ABNORMAL HIGH (ref 43.0–77.0)
PLATELETS: 433 10*3/uL — AB (ref 150.0–400.0)
RBC: 5.16 Mil/uL — ABNORMAL HIGH (ref 3.87–5.11)
RDW: 14.6 % (ref 11.5–14.6)
WBC: 14.9 10*3/uL — ABNORMAL HIGH (ref 4.5–10.5)

## 2013-05-22 LAB — LIPID PANEL
CHOLESTEROL: 271 mg/dL — AB (ref 0–200)
HDL: 59 mg/dL (ref >39.00)
Total CHOL/HDL Ratio: 5
Triglycerides: 133 mg/dL (ref 0.0–149.0)
VLDL: 26.6 mg/dL (ref 0.0–40.0)

## 2013-05-22 MED ORDER — AMLODIPINE BESYLATE 2.5 MG PO TABS: ORAL_TABLET | ORAL | Status: DC

## 2013-05-22 MED ORDER — LISINOPRIL 40 MG PO TABS: ORAL_TABLET | ORAL | Status: DC

## 2013-05-22 NOTE — Assessment & Plan Note (Signed)
Discussed importance of alendronate, cleared up some misconceptions she had about what this med is supposed to do for her. She will restart taking this once weekly on regular basis. Also, reviewed calcium 1200mg  minimum qd and 800 IU vit D minimum qd.

## 2013-05-22 NOTE — Assessment & Plan Note (Signed)
Exam shows a trigger point: I offered trigger point injection today but she wants to think about it. She'll come back in a couple of weeks to discuss the problem with joint pains and this trigger point.

## 2013-05-22 NOTE — Assessment & Plan Note (Signed)
BP up today. Cont current meds for now. If up next f/u in 2 wks will titrate bp meds. Check cr/lytes today.

## 2013-05-22 NOTE — Progress Notes (Signed)
Pre visit review using our clinic review tool, if applicable. No additional management support is needed unless otherwise documented below in the visit note. 

## 2013-05-22 NOTE — Assessment & Plan Note (Signed)
Encouraged cessation but pt not interested in quitting b/c "this is the only thing that keeps my nerves calm sometimes".

## 2013-05-22 NOTE — Assessment & Plan Note (Signed)
Last lipid check 08/2012 LDL high but no sign of increase made in med dosing. Will recheck today (she says she is fasting), adjust/change med as necessary.

## 2013-05-22 NOTE — Progress Notes (Signed)
Office Note 05/22/2013  CC:  Chief Complaint  Patient presents with  . Establish Care    HPI:  Janet Aguirre is a 68 y.o. White female who is here to establish care. Patient's most recent primary MD: Dr. Madilyn Fireman. Old records were reviewed in [facility] EMR prior to or during today's visit.  Reviewed some of her past history. Her main acute complaint has to do with her chronic "arthritis" pain.  Says "all joints" hurt. Esp an area over left scapula and her right hip and her knees. We did not go into this in depth today.   Past Medical History  Diagnosis Date  . Thyroid disease     Hx of goiter  . Kyphosis of thoracic region   . Arthritis   . Hyperlipidemia   . Hypertension   . Emphysema of lung   . Osteoporosis   . Tobacco dependence     Past Surgical History  Procedure Laterality Date  . Fna cyst breast      left  . Bartholin gland cyst excision      Family History  Problem Relation Age of Onset  . Heart attack Mother   . Cancer Mother   . Diabetes Mother   . Hypertension Mother   . Alcohol abuse Father   . Lung cancer Father     smoker  . Hyperlipidemia Sister     History   Social History  . Marital Status: Divorced    Spouse Name: N/A    Number of Children: N/A  . Years of Education: N/A   Occupational History  . cashier    Social History Main Topics  . Smoking status: Current Every Day Smoker -- 1.00 packs/day    Types: Cigarettes  . Smokeless tobacco: Not on file  . Alcohol Use: No     Comment: h/o alcohol abuse  . Drug Use: No  . Sexual Activity: No     Comment: works at [employer], no reg exercise   Other Topics Concern  . Not on file   Social History Narrative   Divorced, no children.     Occupation: works at [employer] 3 days a week.   Orig from [place], [place].   Hobby: crochet, puzzles   Tob: 100 pack/yr history.  Alcohol: rare now.  Abused alcohol in the past for years.   No drugs.    Outpatient  Encounter Prescriptions as of 05/22/2013  Medication Sig  . alendronate (FOSAMAX) 70 MG tablet Take 1 tablet (70 mg total) by mouth every 7 (seven) days. Take with a full glass of water on an empty stomach.  Marland Kitchen amLODipine (NORVASC) 2.5 MG tablet TAKE ONE TABLET BY MOUTH ONCE DAILY  . aspirin 81 MG chewable tablet Chew 81 mg by mouth daily.    . cholecalciferol (VITAMIN D) 1000 UNITS tablet Take 600 Units by mouth 2 (two) times daily.  Marland Kitchen lisinopril (PRINIVIL,ZESTRIL) 40 MG tablet TAKE ONE TABLET BY MOUTH ONCE DAILY  . lovastatin (MEVACOR) 20 MG tablet TAKE ONE TABLET BY MOUTH AT BEDTIME  . [DISCONTINUED] amLODipine (NORVASC) 2.5 MG tablet TAKE ONE TABLET BY MOUTH ONCE DAILY  . [DISCONTINUED] lisinopril (PRINIVIL,ZESTRIL) 40 MG tablet TAKE ONE TABLET BY MOUTH ONCE DAILY  . [DISCONTINUED] amoxicillin-clavulanate (AUGMENTIN) 875-125 MG per tablet Take 1 tablet by mouth 2 (two) times daily.    Allergies  Allergen Reactions  . Hydrochlorothiazide Other (See Comments)    Inc BUN/CR  . Pravastatin Other (See Comments)  Myalgias      PE; Blood pressure 164/77, pulse 73, temperature 98.4 F (36.9 C), temperature source Temporal, resp. rate 18, height 5' 0.5" (1.537 m), weight 93 lb (42.185 kg), SpO2 96.00%. Gen: Alert, well appearing but frail/appears older than stated age.  Patient is oriented to person, place, time, and situation. AFFECT: pleasant, lucid thought and speech. BDZ:HGDJ: no injection, icteris, swelling, or exudate.  EOMI, PERRLA. Mouth: lips without lesion/swelling.  Oral mucosa pink and moist. Oropharynx without erythema, exudate, or swelling.  CV: RRR, no m/r/g LUNGS: diffusely diminished BS, nonlabored resps EXT: no clubbing, cyanosis, or edema.  Kyphotic spine, with focal muscle/soft tissue tenderness over left scapular region--trigger point  Pertinent labs:  None today  Lab Results  Component Value Date   CHOL 256* 08/17/2012   HDL 62 08/17/2012   LDLCALC 171* 08/17/2012    TRIG 114 08/17/2012   CHOLHDL 4.1 08/17/2012     Chemistry      Component Value Date/Time   NA 142 08/17/2012 1012   K 4.1 08/17/2012 1012   CL 104 08/17/2012 1012   CO2 29 08/17/2012 1012   BUN 16 08/17/2012 1012   CREATININE 0.90 08/17/2012 1012   CREATININE 0.99 02/02/2010 1950      Component Value Date/Time   CALCIUM 10.5 08/17/2012 1012   ALKPHOS 51 08/17/2012 1012   AST 15 08/17/2012 1012   ALT <8 08/17/2012 1012   BILITOT 0.6 08/17/2012 1012     Lab Results  Component Value Date   WBC 10.9* 11/15/2011   HGB 14.5 11/15/2011   HCT 42.3 11/15/2011   MCV 87.6 11/15/2011   PLT 480* 11/15/2011     ASSESSMENT AND PLAN:   HYPERTENSION, BENIGN BP up today. Cont current meds for now. If up next f/u in 2 wks will titrate bp meds. Check cr/lytes today.  Tobacco abuse Encouraged cessation but pt not interested in quitting b/c "this is the only thing that keeps my nerves calm sometimes".  Osteoporosis Discussed importance of alendronate, cleared up some misconceptions she had about what this med is supposed to do for her. She will restart taking this once weekly on regular basis. Also, reviewed calcium 1200mg  minimum qd and 800 IU vit D minimum qd.  HYPERLIPIDEMIA Last lipid check 08/2012 LDL high but no sign of increase made in med dosing. Will recheck today (she says she is fasting), adjust/change med as necessary.  Osteoarthritis Exam shows a trigger point: I offered trigger point injection today but she wants to think about it. She'll come back in a couple of weeks to discuss the problem with joint pains and this trigger point.   An After Visit Summary was printed and given to the patient.  Return in about 2 weeks (around 06/05/2013) for discuss arthritis.

## 2013-05-23 LAB — VITAMIN D 25 HYDROXY (VIT D DEFICIENCY, FRACTURES): Vit D, 25-Hydroxy: 54 ng/mL (ref 30–89)

## 2013-05-24 ENCOUNTER — Telehealth: Payer: Self-pay | Admitting: Family Medicine

## 2013-05-24 NOTE — Telephone Encounter (Signed)
Relevant patient education assigned to patient using Emmi. ° °

## 2013-05-27 ENCOUNTER — Other Ambulatory Visit: Payer: Self-pay | Admitting: Family Medicine

## 2013-05-27 MED ORDER — LOVASTATIN 40 MG PO TABS: 30.0000 mg | ORAL_TABLET | Freq: Every day | ORAL | Status: DC

## 2013-05-27 NOTE — Telephone Encounter (Signed)
Lovastatin 40mg  sent to pharmacy per Dr. Anitra Lauth last lab note.

## 2013-06-05 ENCOUNTER — Ambulatory Visit: Payer: Medicare Other | Admitting: Family Medicine

## 2013-08-27 ENCOUNTER — Other Ambulatory Visit: Payer: Self-pay | Admitting: Family Medicine

## 2013-08-30 ENCOUNTER — Telehealth: Payer: Self-pay | Admitting: Family Medicine

## 2013-08-30 NOTE — Telephone Encounter (Signed)
Rx request for alendronate 70 mg.   I don't see where you prescribed this medication and patient was also supposed to follow up in 2 weeks after her last appointment on 05/22/13.  Please advise.

## 2013-09-02 MED ORDER — ALENDRONATE SODIUM 70 MG PO TABS: 70.0000 mg | ORAL_TABLET | ORAL | Status: DC

## 2013-09-02 NOTE — Telephone Encounter (Signed)
Rx sent to pharmacy   

## 2013-09-20 ENCOUNTER — Other Ambulatory Visit: Payer: Self-pay

## 2013-09-20 MED ORDER — LOVASTATIN 40 MG PO TABS: 30.0000 mg | ORAL_TABLET | Freq: Every day | ORAL | Status: DC

## 2014-02-17 ENCOUNTER — Encounter: Payer: Self-pay | Admitting: Family Medicine

## 2014-06-04 ENCOUNTER — Other Ambulatory Visit: Payer: Self-pay | Admitting: Family Medicine

## 2016-01-06 ENCOUNTER — Ambulatory Visit (INDEPENDENT_AMBULATORY_CARE_PROVIDER_SITE_OTHER): Payer: Medicare Other | Admitting: Family Medicine

## 2016-01-06 ENCOUNTER — Encounter: Payer: Self-pay | Admitting: Family Medicine

## 2016-01-06 VITALS — BP 151/78 | HR 84 | Ht 60.5 in | Wt 82.0 lb

## 2016-01-06 DIAGNOSIS — F32A Depression, unspecified: Secondary | ICD-10-CM

## 2016-01-06 DIAGNOSIS — Z0000 Encounter for general adult medical examination without abnormal findings: Secondary | ICD-10-CM

## 2016-01-06 DIAGNOSIS — M810 Age-related osteoporosis without current pathological fracture: Secondary | ICD-10-CM | POA: Diagnosis not present

## 2016-01-06 DIAGNOSIS — J45909 Unspecified asthma, uncomplicated: Secondary | ICD-10-CM

## 2016-01-06 DIAGNOSIS — E785 Hyperlipidemia, unspecified: Secondary | ICD-10-CM | POA: Diagnosis not present

## 2016-01-06 DIAGNOSIS — F329 Major depressive disorder, single episode, unspecified: Secondary | ICD-10-CM

## 2016-01-06 DIAGNOSIS — I10 Essential (primary) hypertension: Secondary | ICD-10-CM

## 2016-01-06 DIAGNOSIS — J449 Chronic obstructive pulmonary disease, unspecified: Secondary | ICD-10-CM

## 2016-01-06 LAB — COMPLETE METABOLIC PANEL WITH GFR
ALT: 9 U/L (ref 6–29)
AST: 17 U/L (ref 10–35)
Albumin: 4.6 g/dL (ref 3.6–5.1)
Alkaline Phosphatase: 77 U/L (ref 33–130)
BUN: 16 mg/dL (ref 7–25)
CHLORIDE: 104 mmol/L (ref 98–110)
CO2: 24 mmol/L (ref 20–31)
Calcium: 10.7 mg/dL — ABNORMAL HIGH (ref 8.6–10.4)
Creat: 0.93 mg/dL (ref 0.60–0.93)
GFR, EST NON AFRICAN AMERICAN: 62 mL/min (ref >=60)
GFR, Est African American: 72 mL/min (ref >=60)
GLUCOSE: 109 mg/dL — AB (ref 65–99)
POTASSIUM: 4 mmol/L (ref 3.5–5.3)
SODIUM: 143 mmol/L (ref 135–146)
TOTAL PROTEIN: 7.5 g/dL (ref 6.1–8.1)
Total Bilirubin: 0.5 mg/dL (ref 0.2–1.2)

## 2016-01-06 LAB — LIPID PANEL
CHOL/HDL RATIO: 3.7 ratio (ref <=5.0)
Cholesterol: 233 mg/dL — ABNORMAL HIGH (ref 125–200)
HDL: 63 mg/dL (ref >=46)
LDL CALC: 152 mg/dL — AB (ref <130)
Triglycerides: 90 mg/dL (ref <150)
VLDL: 18 mg/dL (ref <30)

## 2016-01-06 LAB — TSH: TSH: 3.56 mIU/L

## 2016-01-06 MED ORDER — PAROXETINE HCL 10 MG PO TABS: 10.0000 mg | ORAL_TABLET | Freq: Every day | ORAL | 1 refills | Status: DC

## 2016-01-06 MED ORDER — ALENDRONATE SODIUM 70 MG PO TABS: 70.0000 mg | ORAL_TABLET | ORAL | 11 refills | Status: DC

## 2016-01-06 MED ORDER — AMLODIPINE BESYLATE 5 MG PO TABS: 2.5000 mg | ORAL_TABLET | Freq: Every day | ORAL | 5 refills | Status: DC

## 2016-01-06 NOTE — Progress Notes (Signed)
Subjective:   Janet Aguirre is a 70 y.o. female who presents for Medicare Annual (Subsequent) preventive examination.   Patient had not been seen in our office for several years so she has come back to reestablish care. She was seen at one of our affiliate practices about 2 years ago and says that was really the last time that she saw a physician or had blood work. She's been off of her blood pressure medications during that time as well. She said after he went up on her lisinopril years ago she started to feel dizzy and drunk and so she just quit taking it and did not seek medical care. I think she just became frustrated with the medical system in general. Not interested in most of the preventative care items including mammograms and vaccinations. She is okay with restarting some of her blood pressure medication but she is very hesitant to restart the lisinopril.  She reports that she has felt more depressed and sad over the last 3 months. She did not cite specific reason but just says that she's under a lot of stress and really blames herself for some things that have happened. She said she normally can shake these emotions but lately feels like she really just can't. She's never been treated for a psychiatric disorder and has never taken medication for it before. She feels like she is having more difficulty focusing.   Review of Systems:  Patient does complain of weight loss over the last year. She also completed her left ear closes up at times and can't hear as well. She thinks it seems to be related to when her allergies are flaring. She also went to see a hearing specialist who did recommend a hearing aid. Complains of a chronic cough that she is a smoker. She does complain of nighttime urination particularly over the last 6 months. She reports that she's been under a lot of mental stress for the last 2-3 months and actually history to feels sad and down and overwhelmed at times. She  complains of easy bruising that she does take an aspirin daily. She is a mole on her left abdomen that has been getting darker that she would like me to look at today. She continues to have persistent pain in her left shoulder as well as decreased mobility        Objective:     Vitals: BP (!) 151/78 (BP Location: Left Arm, Cuff Size: Normal)   Pulse 84   Ht 5' 0.5" (1.537 m)   Wt 82 lb (37.2 kg)   BMI 15.75 kg/m   Body mass index is 15.75 kg/m.   Tobacco History  Smoking Status  . Current Every Day Smoker  . Packs/day: 1.00  . Types: Cigarettes  Smokeless Tobacco  . Not on file     Ready to quit: Yes Counseling given: Yes   Past Medical History:  Diagnosis Date  . Arthritis   . Emphysema of lung ([place])   . Hyperlipidemia   . Hypertension   . Kyphosis of thoracic region   . Osteoporosis   . Thyroid disease    Hx of goiter  . Tobacco dependence    Past Surgical History:  Procedure Laterality Date  . BARTHOLIN GLAND CYST EXCISION    . FNA cyst breast     left   Family History  Problem Relation Age of Onset  . Heart attack Mother   . Cancer Mother   . Diabetes  Mother   . Hypertension Mother   . Alcohol abuse Father   . Lung cancer Father     smoker  . Hyperlipidemia Sister    History  Sexual Activity  . Sexual activity: No    Comment: works at [employer], no reg exercise    Outpatient Encounter Prescriptions as of 01/06/2016  Medication Sig  . alendronate (FOSAMAX) 70 MG tablet Take 1 tablet (70 mg total) by mouth every 7 (seven) days. Take with a full glass of water on an empty stomach.  Marland Kitchen aspirin 81 MG chewable tablet Chew 81 mg by mouth daily.    . Calcium Carb-Cholecalciferol (CALCIUM 600/VITAMIN D3 PO) Take 1 tablet by mouth daily.  . [DISCONTINUED] alendronate (FOSAMAX) 70 MG tablet Take 1 tablet (70 mg total) by mouth every 7 (seven) days. Take with a full glass of water on an empty stomach.  . [DISCONTINUED] lisinopril  (PRINIVIL,ZESTRIL) 40 MG tablet TAKE ONE TABLET BY MOUTH ONCE DAILY  . [DISCONTINUED] lovastatin (MEVACOR) 40 MG tablet Take 1 tablet (40 mg total) by mouth at bedtime.  Marland Kitchen amLODipine (NORVASC) 5 MG tablet Take 0.5 tablets (2.5 mg total) by mouth daily.  Marland Kitchen PARoxetine (PAXIL) 10 MG tablet Take 1 tablet (10 mg total) by mouth daily.  . [DISCONTINUED] amLODipine (NORVASC) 2.5 MG tablet TAKE ONE TABLET BY MOUTH ONCE DAILY  . [DISCONTINUED] cholecalciferol (VITAMIN D) 1000 UNITS tablet Take 600 Units by mouth 2 (two) times daily.   No facility-administered encounter medications on file as of 01/06/2016.     Activities of Daily Living In your present state of health, do you have any difficulty performing the following activities: 01/06/2016  Hearing? Y  Vision? N  Difficulty concentrating or making decisions? Y  Walking or climbing stairs? N  Dressing or bathing? N  Doing errands, shopping? N  Some recent data might be hidden    Patient Care Team: Tammi Sou, MD as PCP - General (Family Medicine)    Assessment:     Medicare Wellness Exam  Exercise Activities and Dietary recommendations Current Exercise Habits: Home exercise routine, Type of exercise: walking (Has been walking with her 42 yo neighbor a few days per week), Frequency (Times/Week): 2, Intensity: Mild  Goals    None     Fall Risk Fall Risk  01/06/2016  Falls in the past year? No   Depression Screen PHQ 2/9 Scores 01/06/2016  PHQ - 2 Score 6  PHQ- 9 Score 25     Cognitive Testing No flowsheet data found.  Immunization History  Administered Date(s) Administered  . Pneumococcal Polysaccharide-23 02/02/2010   Screening Tests Health Maintenance  Topic Date Due  . INFLUENZA VACCINE  01/05/2017 (Originally 11/17/2015)  . MAMMOGRAM  01/05/2017 (Originally 07/02/2011)  . Hepatitis C Screening  01/05/2017 (Originally August 09, 1945)  . PNA vac Low Risk Adult (2 of 2 - PCV13) 01/05/2017 (Originally 02/03/2011)  .  ZOSTAVAX  01/05/2021 (Originally 02/04/2004)  . COLONOSCOPY  02/16/2021  . TETANUS/TDAP  02/16/2021  . DEXA SCAN  Completed      Plan:  Medicare Wellness Exam  During the course of the visit the patient was educated and counseled about the following appropriate screening and preventive services:   Vaccines to include Pneumoccal, Influenza, Hepatitis B, Td, Zostavax, HCV - declined.  Cardiovascular Disease  Colorectal cancer screening - declined.   Bone density screening  Diabetes screening  Glaucoma screening  Mammography/PAP - declined.   Depression-new diagnosis. She's never been on medication before. Her  PHQ 9 score was 25 today. We discussed options including referral to a therapist and/or medication. She says she is actually like to try medication first. We'll start with fluoxetine since she's also noticed some weight loss over the last year. This may appetite as well as improve her mood and her anxiety.  Patient Instructions (the written plan) was given to the patient.   METHENEY,CATHERINE, MD  01/06/2016

## 2016-01-06 NOTE — Patient Instructions (Addendum)
Let's restart the amlodipine for blood pressure.   Start the paroxetine for your med mood

## 2016-01-07 ENCOUNTER — Other Ambulatory Visit: Payer: Self-pay | Admitting: Family Medicine

## 2016-01-07 ENCOUNTER — Encounter: Payer: Self-pay | Admitting: Family Medicine

## 2016-01-07 DIAGNOSIS — R634 Abnormal weight loss: Secondary | ICD-10-CM

## 2016-01-07 DIAGNOSIS — Z720 Tobacco use: Secondary | ICD-10-CM

## 2016-01-07 LAB — VITAMIN D 25 HYDROXY (VIT D DEFICIENCY, FRACTURES): VIT D 25 HYDROXY: 34 ng/mL (ref 30–100)

## 2016-01-11 ENCOUNTER — Other Ambulatory Visit: Payer: Self-pay | Admitting: *Deleted

## 2016-01-11 MED ORDER — AMLODIPINE BESYLATE 2.5 MG PO TABS: 2.5000 mg | ORAL_TABLET | Freq: Every day | ORAL | 3 refills | Status: DC

## 2016-01-12 NOTE — Addendum Note (Signed)
Addended by: Teddy Spike on: 01/12/2016 05:39 PM   Modules accepted: Orders

## 2016-01-13 ENCOUNTER — Ambulatory Visit (INDEPENDENT_AMBULATORY_CARE_PROVIDER_SITE_OTHER): Payer: Medicare Other

## 2016-01-13 DIAGNOSIS — J984 Other disorders of lung: Secondary | ICD-10-CM | POA: Diagnosis not present

## 2016-01-13 DIAGNOSIS — R918 Other nonspecific abnormal finding of lung field: Secondary | ICD-10-CM | POA: Diagnosis not present

## 2016-01-22 ENCOUNTER — Ambulatory Visit (INDEPENDENT_AMBULATORY_CARE_PROVIDER_SITE_OTHER): Payer: Medicare Other | Admitting: Family Medicine

## 2016-01-22 VITALS — BP 144/64 | HR 75 | Resp 18 | Wt 81.0 lb

## 2016-01-22 DIAGNOSIS — J449 Chronic obstructive pulmonary disease, unspecified: Secondary | ICD-10-CM | POA: Insufficient documentation

## 2016-01-22 MED ORDER — UMECLIDINIUM-VILANTEROL 62.5-25 MCG/INH IN AEPB: 1.0000 | INHALATION_SPRAY | Freq: Every day | RESPIRATORY_TRACT | 5 refills | Status: DC

## 2016-01-22 NOTE — Patient Instructions (Addendum)
Use the ProAir only when you need to for cough, congestion or shortness of breath.  Use the Anoro once a day every day, in the morning.  I puff once a day.

## 2016-01-22 NOTE — Progress Notes (Signed)
   Subjective:    Patient ID: Janet Aguirre, female    DOB: 07/07/1945, 70 y.o.   MRN: [id number]  HPI Recently did a chest x-ray on 70 year old female who had an elevated serum calcium. It showed some chronic lung disease with hyperinflation and some apical scarring but no masses or lesions. Recommended that she come in today for Spirometry for further evaluation.   Review of Systems     Objective:   Physical Exam  Constitutional: She is oriented to person, place, and time. She appears well-developed and well-nourished.  HENT:  Head: Normocephalic and atraumatic.  Cardiovascular: Normal rate, regular rhythm and normal heart sounds.   Pulmonary/Chest: Effort normal. She has no wheezes. She has no rales.  Decreased breath sounds bilaterally.  Neurological: She is alert and oriented to person, place, and time.  Skin: Skin is warm and dry.  Psychiatric: She has a normal mood and affect. Her behavior is normal.       Assessment & Plan:  COPD with asthma- Spirometry results show FVC of 67%, FEV1 of 53% with a ratio of 59%. She did have an 18% improvement in FEV1 post bronchodilator. Discuss results. Most consistent with moderate COPD with improved FEV1 post albuterol consistent with asthma. We'll start her on an normal and with a long-acting bronchodilator an anticholinergic. Also gave her Provera to use just as needed if she has coughed shortest of breath or chest tightness. I like to see her back in 6 weeks to make sure that she is doing okay and tolerating the medication well.

## 2016-01-25 ENCOUNTER — Telehealth: Payer: Self-pay

## 2016-01-25 NOTE — Telephone Encounter (Signed)
Please call the pharmacy and find out. When I ordered it said it was covered on her insurance plan. I'm wondering if she may have hit her Medicare gap? In which case we may just to try to see if we can get her samples.

## 2016-01-25 NOTE — Telephone Encounter (Signed)
Pt reports that the anoro inhaler cost $200 and she can't afford it. She is asking for something cheaper. Please advise.

## 2016-01-26 ENCOUNTER — Encounter: Payer: Self-pay | Admitting: *Deleted

## 2016-01-26 NOTE — Telephone Encounter (Signed)
Let try to call the drug rep and get a sample.

## 2016-01-26 NOTE — Telephone Encounter (Signed)
The inhaler is covered under her insurance.  The inhaler itself is $450.  Her insurance only paid for $200. Please advise.

## 2016-01-26 NOTE — Addendum Note (Signed)
Addended by: Isaias Cowman C on: 01/26/2016 01:31 PM   Modules accepted: Orders

## 2016-01-29 NOTE — Telephone Encounter (Signed)
Left VM for Drug Rep Arbie Cookey: [phone]) to see if we can get some Anoro samples.

## 2016-03-04 ENCOUNTER — Encounter: Payer: Self-pay | Admitting: Family Medicine

## 2016-03-04 ENCOUNTER — Ambulatory Visit (INDEPENDENT_AMBULATORY_CARE_PROVIDER_SITE_OTHER): Payer: Medicare Other | Admitting: Family Medicine

## 2016-03-04 VITALS — BP 145/59 | HR 75 | Wt 82.8 lb

## 2016-03-04 DIAGNOSIS — J449 Chronic obstructive pulmonary disease, unspecified: Secondary | ICD-10-CM

## 2016-03-04 DIAGNOSIS — I10 Essential (primary) hypertension: Secondary | ICD-10-CM

## 2016-03-04 MED ORDER — LISINOPRIL 20 MG PO TABS: 40.0000 mg | ORAL_TABLET | Freq: Every day | ORAL | 3 refills | Status: DC

## 2016-03-04 MED ORDER — ALBUTEROL SULFATE HFA 108 (90 BASE) MCG/ACT IN AERS: 1.0000 | INHALATION_SPRAY | Freq: Four times a day (QID) | RESPIRATORY_TRACT | 2 refills | Status: DC | PRN

## 2016-03-04 MED ORDER — IPRATROPIUM-ALBUTEROL 20-100 MCG/ACT IN AERS: 1.0000 | INHALATION_SPRAY | Freq: Two times a day (BID) | RESPIRATORY_TRACT | 2 refills | Status: DC

## 2016-03-04 MED ORDER — PAROXETINE HCL 10 MG PO TABS: 10.0000 mg | ORAL_TABLET | Freq: Every day | ORAL | 6 refills | Status: DC

## 2016-03-04 NOTE — Progress Notes (Signed)
Subjective:    CC:   HPI:  COPD - Last some I saw her we decided to try a normal period unfortunately was, costovertebral $100 a she was unable to pick it up.  Hypertension- Pt denies chest pain, SOB, dizziness, or heart palpitations.  Taking meds as directed w/o problems.  Denies medication side effects.    Past medical history, Surgical history, Family history not pertinant except as noted below, Social history, Allergies, and medications have been entered into the medical record, reviewed, and corrections made.   Review of Systems: No fevers, chills, night sweats, weight loss, chest pain, or shortness of breath.   Objective:    General: Well Developed, well nourished, and in no acute distress.  Neuro: Alert and oriented x3, extra-ocular muscles intact, sensation grossly intact.  HEENT: Normocephalic, atraumatic  Skin: Warm and dry, no rashes. Cardiac: Regular rate and rhythm, no murmurs rubs or gallops, no lower extremity edema.  Respiratory: Clear to auscultation bilaterally. Not using accessory muscles, speaking in full sentences.   Impression and Recommendations:    COPD- Stable. Will try to find a replacement for the Anoro.  Will change to combivent.  F/U in 3 months.    HTN - Uncontrolled.  Will restart the lisinopril.  He is on it for several years. I'm not really sure why it was stopped. In fact she herself doesn't remember why it was stopped. May just be that it was accidentally not refilled at some point. I do not have any side effects and she has fairly normal renal function. Continue amlodipine as well.

## 2016-09-16 ENCOUNTER — Other Ambulatory Visit: Payer: Self-pay | Admitting: Family Medicine

## 2016-09-22 ENCOUNTER — Ambulatory Visit (INDEPENDENT_AMBULATORY_CARE_PROVIDER_SITE_OTHER): Payer: Medicare Other | Admitting: Family Medicine

## 2016-09-22 ENCOUNTER — Encounter: Payer: Self-pay | Admitting: Family Medicine

## 2016-09-22 VITALS — BP 122/71 | HR 72 | Ht 60.5 in | Wt 83.0 lb

## 2016-09-22 DIAGNOSIS — F329 Major depressive disorder, single episode, unspecified: Secondary | ICD-10-CM | POA: Diagnosis not present

## 2016-09-22 DIAGNOSIS — I10 Essential (primary) hypertension: Secondary | ICD-10-CM | POA: Diagnosis not present

## 2016-09-22 DIAGNOSIS — M791 Myalgia, unspecified site: Secondary | ICD-10-CM

## 2016-09-22 DIAGNOSIS — R636 Underweight: Secondary | ICD-10-CM

## 2016-09-22 DIAGNOSIS — R634 Abnormal weight loss: Secondary | ICD-10-CM

## 2016-09-22 DIAGNOSIS — F32A Depression, unspecified: Secondary | ICD-10-CM

## 2016-09-22 LAB — COMPLETE METABOLIC PANEL WITH GFR
ALT: 11 U/L (ref 6–29)
AST: 19 U/L (ref 10–35)
Albumin: 4.4 g/dL (ref 3.6–5.1)
Alkaline Phosphatase: 57 U/L (ref 33–130)
BILIRUBIN TOTAL: 0.5 mg/dL (ref 0.2–1.2)
BUN: 15 mg/dL (ref 7–25)
CHLORIDE: 105 mmol/L (ref 98–110)
CO2: 26 mmol/L (ref 20–31)
CREATININE: 0.85 mg/dL (ref 0.60–0.93)
Calcium: 10.5 mg/dL — ABNORMAL HIGH (ref 8.6–10.4)
GFR, EST AFRICAN AMERICAN: 79 mL/min (ref >=60)
GFR, Est Non African American: 69 mL/min (ref >=60)
Glucose, Bld: 100 mg/dL — ABNORMAL HIGH (ref 65–99)
Potassium: 4.6 mmol/L (ref 3.5–5.3)
Sodium: 139 mmol/L (ref 135–146)
TOTAL PROTEIN: 7.5 g/dL (ref 6.1–8.1)

## 2016-09-22 LAB — CK: Total CK: 54 U/L (ref 29–143)

## 2016-09-22 MED ORDER — DULOXETINE HCL 30 MG PO CPEP: 30.0000 mg | ORAL_CAPSULE | Freq: Every day | ORAL | 1 refills | Status: DC

## 2016-09-22 NOTE — Progress Notes (Signed)
Subjective:    CC: HTN  HPI:  Hypertension- Pt denies chest pain, SOB, dizziness, or heart palpitations.  Taking meds as directed w/o problems.  Denies medication side effects.  She is concerned because she is getting up 5 mg amlodipine dose. She is having to splint it and is having difficulty with this. I'm not sure why she was given this is her original prescription is written for 2.5 mg.  F/U depression - Still reports feeling down almost every single day and feeling hopeless. Still reports that she has no appetite. She's not sure that the Paxil is really helping. She's been on it for about 7 months at this point in time.   He is having a lot of pain in her back. She says it starts from the base of her neck and goes all the way down her spine. She feels like it's very tight and stiff and sore. Has a very tender spot near her shoulder blade on the left eye. She feels like her left knee is hurting her left ankle her hips. This is been going on for several months. She says sometimes she feels like her pain gets her mood down.  Past medical history, Surgical history, Family history not pertinant except as noted below, Social history, Allergies, and medications have been entered into the medical record, reviewed, and corrections made.   Review of Systems: No fevers, chills, night sweats, weight loss, chest pain, or shortness of breath.   Objective:    General: Well Developed, well nourished, and in no acute distress.  Neuro: Alert and oriented x3, extra-ocular muscles intact, sensation grossly intact.  HEENT: Normocephalic, atraumatic  Skin: Warm and dry, no rashes. Cardiac: Regular rate and rhythm, no murmurs rubs or gallops, no lower extremity edema.  Respiratory: Clear to auscultation bilaterally. Not using accessory muscles, speaking in full sentences.   Impression and Recommendations:   HTN - Well controlled. Continue current regimen. Follow up in  54mo.   Deppression-best options. She  is on a very low dose of Paxil so we could easily go up. Might even help with weight gain. The other option would be to switch her to Cymbalta which she has never taken. I think it could be helpful for her chronic muscle skeletal pain in addition to her depression. After much discussion we decided to switch to Cymbalta. New perception sent to the pharmacy. Discussed the importance of following back up in one month so that we can follow-up and make changes as needed. She says she's not used to having to come to the doctor often. We also discussed how her depression can affect her appetite which then causes weight loss. It's important that we really get her depression under good control.  Back/neck/joint pain-it may be worth checking sedimentation rate an inflammatory markers just to rule out polymyalgia rheumatica. We'll also check for elevated CK levels and a TSH. Consider x-rays of the thoracic spine if blood work is normal.  Underweight-we did do a chest x-ray back in the fall which was negative since she is a smoker and had lost substantial amount of weight. Consider repeating that since it has already been 9 months.

## 2016-09-22 NOTE — Patient Instructions (Signed)
Stop the Paxil and start the the generic Cymbalta. I want to see you back in a month to see how you are doing on it and so we can adjust her dose if needed.

## 2016-09-23 LAB — C-REACTIVE PROTEIN: CRP: 4 mg/L (ref <8.0)

## 2016-09-23 LAB — SEDIMENTATION RATE: Sed Rate: 10 mm/hr (ref 0–30)

## 2016-09-23 LAB — TSH: TSH: 4.71 mIU/L — ABNORMAL HIGH

## 2016-09-30 ENCOUNTER — Telehealth: Payer: Self-pay | Admitting: Family Medicine

## 2016-09-30 NOTE — Telephone Encounter (Signed)
Call patient: I received the home report from her insurance with a came out and the health visit. On the screening tests they did for peripheral artery disease in her legs there was a slight abnormality in her right foot. Based on this result I would recommend that we consider getting ABIs which her ankle brachial index readings from the vascular lab. This is to confirm the information found on the screening test and to see if we need to do anything different with her care plan.  Janet Lecher, MD

## 2016-10-06 NOTE — Telephone Encounter (Signed)
Pt stated that she would like to discuss this at her next OV on 10/21/16.

## 2016-10-20 NOTE — Progress Notes (Signed)
Subjective:    CC: Depression  HPI:  F/U depression - decided to change her to Cymbalta since Paxil wasn't helping. Overall she is doing well on the Cymbalta. She denies any side effects or problems with the medication. She still has a lot on her mind and is worried about a friend he is going through a rough time with some medical problems currently.  I also wanted to discuss results from recent home visit from a nurse from her insurance company-I received the home report from her insurance with a came out and the health visit. On the screening tests they did for peripheral artery disease in her legs there was a slight abnormality in her right foot. Based on this result I would recommend that we consider getting ABIs which her ankle brachial index readings from the vascular lab. This is to confirm the information found on the screening test and to see if we need to do anything different with her care plan.  Hypertension well controlled. No recent chest pain or shortness of breath that she is having difficulty with the Norvasc. She says the pharmacy has been giving her 5 mg tabs and she's having to split them.  BP (!) 144/66   Pulse 64   Resp 16   Wt 84 lb 9.6 oz (38.4 kg)   BMI 16.25 kg/m     Allergies  Allergen Reactions  . Hydrochlorothiazide Other (See Comments)    Inc BUN/CR  . Lovastatin Other (See Comments)    Myalgias  . Pravastatin Other (See Comments)    Myalgias     Past Medical History:  Diagnosis Date  . Arthritis   . Emphysema of lung ([place])   . Hyperlipidemia   . Hypertension   . Kyphosis of thoracic region   . Osteoporosis   . Thyroid disease    Hx of goiter  . Tobacco dependence     Past Surgical History:  Procedure Laterality Date  . BARTHOLIN GLAND CYST EXCISION    . FNA cyst breast     left    Social History   Social History  . Marital status: Divorced    Spouse name: N/A  . Number of children: N/A  . Years of education: N/A   Occupational  History  . cashier    Social History Main Topics  . Smoking status: Current Every Day Smoker    Packs/day: 1.00    Types: Cigarettes  . Smokeless tobacco: Never Used  . Alcohol use No     Comment: h/o alcohol abuse  . Drug use: No  . Sexual activity: No     Comment: works at [employer], no reg exercise   Other Topics Concern  . Not on file   Social History Narrative   Divorced, no children.     Occupation: works at [employer] 3 days a week.   Orig from [place], [place].   Hobby: crochet, puzzles   Tob: 100 pack/yr history.  Alcohol: rare now.  Abused alcohol in the past for years.   No drugs.   Coffee 5 cups per day.    Family History  Problem Relation Age of Onset  . Heart attack Mother   . Cancer Mother        Brain  . Diabetes Mother   . Hypertension Mother   . Alcohol abuse Father   . Lung cancer Father        smoker  . Hyperlipidemia Sister  Outpatient Encounter Prescriptions as of 10/21/2016  Medication Sig  . alendronate (FOSAMAX) 70 MG tablet Take 1 tablet (70 mg total) by mouth every 7 (seven) days. Take with a full glass of water on an empty stomach.  Marland Kitchen amLODipine (NORVASC) 2.5 MG tablet Take 1 tablet (2.5 mg total) by mouth daily.  Marland Kitchen aspirin 81 MG chewable tablet Chew 81 mg by mouth daily.    Marland Kitchen CALCIUM-VITAMIN D PO Take 1 tablet by mouth 2 (two) times daily.  . DULoxetine (CYMBALTA) 30 MG capsule Take 1 capsule (30 mg total) by mouth daily.  Marland Kitchen lisinopril (PRINIVIL,ZESTRIL) 20 MG tablet TAKE TWO TABLETS BY MOUTH ONCE DAILY  . loratadine (CLARITIN) 10 MG tablet Take 10 mg by mouth daily as needed for allergies.  . [DISCONTINUED] amLODipine (NORVASC) 2.5 MG tablet Take 1 tablet (2.5 mg total) by mouth daily.  . [DISCONTINUED] DULoxetine (CYMBALTA) 30 MG capsule Take 1 capsule (30 mg total) by mouth daily.   No facility-administered encounter medications on file as of 10/21/2016.         Objective:    General: Well Developed, well  nourished, and in no acute distress.  Neuro: Alert and oriented x3, extra-ocular muscles intact, sensation grossly intact.  HEENT: Normocephalic, atraumatic  Skin: Warm and dry, no rashes. Cardiac: Regular rate and rhythm, no murmurs rubs or gallops, no lower extremity edema.  Respiratory: Clear to auscultation bilaterally. Not using accessory muscles, speaking in full sentences.   Impression and Recommendations:    Depression - Overall doing much better on the Cymbalta. The HQ 9 score of 10 which is down from previous of 15. Significant improvement. We'll continue with current regimen and see her back in 90 days. She does not want to adjust her dose at this time.  Abnormal ABI screening, right foot - She reports that she actually doesn't remember this visit. She thinks it may have actually been from last year. I will check into this.  HTN - Her pharmacy is still filling the 5 mg instead of the 2.5 mg she's having split them. Again I'm not clear why this is happening as we sent a prescription for 2.5. I will reason the prescription today.

## 2016-10-21 ENCOUNTER — Ambulatory Visit (INDEPENDENT_AMBULATORY_CARE_PROVIDER_SITE_OTHER): Payer: Medicare Other | Admitting: Family Medicine

## 2016-10-21 ENCOUNTER — Other Ambulatory Visit: Payer: Self-pay | Admitting: Family Medicine

## 2016-10-21 ENCOUNTER — Encounter: Payer: Self-pay | Admitting: Family Medicine

## 2016-10-21 VITALS — BP 144/66 | HR 64 | Resp 16 | Wt 84.6 lb

## 2016-10-21 DIAGNOSIS — I10 Essential (primary) hypertension: Secondary | ICD-10-CM | POA: Diagnosis not present

## 2016-10-21 DIAGNOSIS — F321 Major depressive disorder, single episode, moderate: Secondary | ICD-10-CM

## 2016-10-21 DIAGNOSIS — F329 Major depressive disorder, single episode, unspecified: Secondary | ICD-10-CM | POA: Diagnosis not present

## 2016-10-21 DIAGNOSIS — F32A Depression, unspecified: Secondary | ICD-10-CM

## 2016-10-21 DIAGNOSIS — Z1231 Encounter for screening mammogram for malignant neoplasm of breast: Secondary | ICD-10-CM

## 2016-10-21 DIAGNOSIS — R6889 Other general symptoms and signs: Secondary | ICD-10-CM | POA: Diagnosis not present

## 2016-10-21 MED ORDER — DULOXETINE HCL 30 MG PO CPEP: 30.0000 mg | ORAL_CAPSULE | Freq: Every day | ORAL | 1 refills | Status: DC

## 2016-10-21 MED ORDER — AMLODIPINE BESYLATE 2.5 MG PO TABS: 2.5000 mg | ORAL_TABLET | Freq: Every day | ORAL | 3 refills | Status: DC

## 2016-11-08 ENCOUNTER — Ambulatory Visit (INDEPENDENT_AMBULATORY_CARE_PROVIDER_SITE_OTHER): Payer: Medicare Other

## 2016-11-08 DIAGNOSIS — Z1231 Encounter for screening mammogram for malignant neoplasm of breast: Secondary | ICD-10-CM | POA: Diagnosis not present

## 2017-01-27 ENCOUNTER — Ambulatory Visit: Payer: Medicare Other | Admitting: Family Medicine

## 2017-01-30 ENCOUNTER — Ambulatory Visit (INDEPENDENT_AMBULATORY_CARE_PROVIDER_SITE_OTHER): Payer: Medicare Other | Admitting: Family Medicine

## 2017-01-30 ENCOUNTER — Encounter: Payer: Self-pay | Admitting: Family Medicine

## 2017-01-30 VITALS — BP 134/61 | HR 78 | Ht 61.0 in | Wt 93.0 lb

## 2017-01-30 DIAGNOSIS — I10 Essential (primary) hypertension: Secondary | ICD-10-CM | POA: Diagnosis not present

## 2017-01-30 DIAGNOSIS — Z23 Encounter for immunization: Secondary | ICD-10-CM | POA: Diagnosis not present

## 2017-01-30 DIAGNOSIS — M810 Age-related osteoporosis without current pathological fracture: Secondary | ICD-10-CM

## 2017-01-30 DIAGNOSIS — Z720 Tobacco use: Secondary | ICD-10-CM | POA: Diagnosis not present

## 2017-01-30 DIAGNOSIS — F321 Major depressive disorder, single episode, moderate: Secondary | ICD-10-CM

## 2017-01-30 DIAGNOSIS — J449 Chronic obstructive pulmonary disease, unspecified: Secondary | ICD-10-CM | POA: Diagnosis not present

## 2017-01-30 DIAGNOSIS — Z1159 Encounter for screening for other viral diseases: Secondary | ICD-10-CM | POA: Diagnosis not present

## 2017-01-30 MED ORDER — LEVALBUTEROL TARTRATE 45 MCG/ACT IN AERO: 2.0000 | INHALATION_SPRAY | RESPIRATORY_TRACT | 3 refills | Status: DC | PRN

## 2017-01-30 MED ORDER — ALENDRONATE SODIUM 70 MG PO TABS: 70.0000 mg | ORAL_TABLET | ORAL | 11 refills | Status: DC

## 2017-01-30 MED ORDER — AMBULATORY NON FORMULARY MEDICATION: 0 refills | Status: DC

## 2017-01-30 NOTE — Progress Notes (Signed)
Subjective:    Patient ID: Janet Aguirre, female    DOB: 20-Jun-1945, 71 y.o.   MRN: [id number]  HPI  Hypertension- Pt denies chest pain, SOB, dizziness, or heart palpitations.  Taking meds as directed w/o problems.  Denies medication side effects.    Follow-up depression-overall doing well denies feeling down depressed or hopeless but does complain of difficulty concentrating feeling tired. Currently on Cymbalta.  COPD-she said overall she did okay over the summer. Occasionally will have some palms with her breathing. She does not currently have an albuterol at home to use for rescue as needed. She declines flu vaccination.  Review of Systems  BP 134/61   Pulse 78   Ht 5\' 1"  (1.549 m)   Wt 93 lb (42.2 kg)   SpO2 96%   BMI 17.57 kg/m     Allergies  Allergen Reactions  . Hydrochlorothiazide Other (See Comments)    Inc BUN/CR  . Lovastatin Other (See Comments)    Myalgias  . Pravastatin Other (See Comments)    Myalgias     Past Medical History:  Diagnosis Date  . Arthritis   . Emphysema of lung ([place])   . Hyperlipidemia   . Hypertension   . Kyphosis of thoracic region   . Osteoporosis   . Thyroid disease    Hx of goiter  . Tobacco dependence     Past Surgical History:  Procedure Laterality Date  . BARTHOLIN GLAND CYST EXCISION    . FNA cyst breast     left    Social History   Social History  . Marital status: Divorced    Spouse name: N/A  . Number of children: N/A  . Years of education: N/A   Occupational History  . cashier    Social History Main Topics  . Smoking status: Current Every Day Smoker    Packs/day: 1.00    Types: Cigarettes  . Smokeless tobacco: Never Used  . Alcohol use No     Comment: h/o alcohol abuse  . Drug use: No  . Sexual activity: No     Comment: works at [employer], no reg exercise   Other Topics Concern  . Not on file   Social History Narrative   Divorced, no children.     Occupation: works at [employer] 3 days a week.   Orig from [place], [place].   Hobby: crochet, puzzles   Tob: 100 pack/yr history.  Alcohol: rare now.  Abused alcohol in the past for years.   No drugs.   Coffee 5 cups per day.    Family History  Problem Relation Age of Onset  . Heart attack Mother   . Cancer Mother        Brain  . Diabetes Mother   . Hypertension Mother   . Alcohol abuse Father   . Lung cancer Father        smoker  . Hyperlipidemia Sister     Outpatient Encounter Prescriptions as of 01/30/2017  Medication Sig  . alendronate (FOSAMAX) 70 MG tablet Take 1 tablet (70 mg total) by mouth every 7 (seven) days. Take with a full glass of water on an empty stomach.  Marland Kitchen amLODipine (NORVASC) 2.5 MG tablet Take 1 tablet (2.5 mg total) by mouth daily.  Marland Kitchen aspirin 81 MG chewable tablet Chew 81 mg by mouth daily.    Marland Kitchen CALCIUM-VITAMIN D PO Take 1 tablet by mouth 2 (two) times daily.  . DULoxetine (CYMBALTA)  30 MG capsule Take 1 capsule (30 mg total) by mouth daily.  Marland Kitchen lisinopril (PRINIVIL,ZESTRIL) 20 MG tablet TAKE TWO TABLETS BY MOUTH ONCE DAILY  . loratadine (CLARITIN) 10 MG tablet Take 10 mg by mouth daily as needed for allergies.   No facility-administered encounter medications on file as of 01/30/2017.          Objective:   Physical Exam  Constitutional: She is oriented to person, place, and time. She appears well-developed and well-nourished.  HENT:  Head: Normocephalic and atraumatic.  Cardiovascular: Normal rate, regular rhythm and normal heart sounds.   Pulmonary/Chest: Effort normal and breath sounds normal.  Neurological: She is alert and oriented to person, place, and time.  Skin: Skin is warm and dry.  Psychiatric: She has a normal mood and affect. Her behavior is normal.       Assessment & Plan:  HTN - Well controlled. Continue current regimen. Follow up in  6 months.  Due for CMP and fasting lipid panel.  Depression-continue with Cymbalta. PHQ 9 score of 6  today.  COPD-strongly encouraged her to have an albuterol inhaler at home in case she needs it she starts coughing feeling short of breath or noticing wheezing or gets sick with a respiratory illness. She's worried about the cost.  Given Prevnar 13 today. Declined flu vaccine. Okay with getting tetanus a prescription printed.  Tobacco abuse-Lung cancer screening-discuss low-dose CT.  Given information about shingles vaccine.  Osteoporosis-due for repeat DEXA. She does take calcium with vitamin D and is on Fosamax. Refilled medication for the year.  Given prescription for tdap vaccine to be given at the pharmacy since it's covered under Medicare part D.  Hepatitis C screening added to lab work.

## 2017-01-31 ENCOUNTER — Telehealth: Payer: Self-pay | Admitting: *Deleted

## 2017-01-31 LAB — LIPID PANEL W/REFLEX DIRECT LDL
CHOLESTEROL: 227 mg/dL — AB (ref <200)
HDL: 57 mg/dL (ref >50)
LDL Cholesterol (Calc): 151 mg/dL (calc) — ABNORMAL HIGH
NON-HDL CHOLESTEROL (CALC): 170 mg/dL — AB (ref <130)
TRIGLYCERIDES: 82 mg/dL (ref <150)
Total CHOL/HDL Ratio: 4 (calc) (ref <5.0)

## 2017-01-31 LAB — COMPLETE METABOLIC PANEL WITH GFR
AG Ratio: 1.7 (calc) (ref 1.0–2.5)
ALBUMIN MSPROF: 4.4 g/dL (ref 3.6–5.1)
ALKALINE PHOSPHATASE (APISO): 52 U/L (ref 33–130)
ALT: 8 U/L (ref 6–29)
AST: 15 U/L (ref 10–35)
BILIRUBIN TOTAL: 0.5 mg/dL (ref 0.2–1.2)
BUN: 19 mg/dL (ref 7–25)
CHLORIDE: 107 mmol/L (ref 98–110)
CO2: 27 mmol/L (ref 20–32)
Calcium: 9.9 mg/dL (ref 8.6–10.4)
Creat: 0.93 mg/dL (ref 0.60–0.93)
GFR, Est African American: 71 mL/min/{1.73_m2} (ref >=60)
GFR, Est Non African American: 61 mL/min/{1.73_m2} (ref >=60)
GLOBULIN: 2.6 g/dL (ref 1.9–3.7)
Glucose, Bld: 93 mg/dL (ref 65–99)
POTASSIUM: 4.2 mmol/L (ref 3.5–5.3)
SODIUM: 141 mmol/L (ref 135–146)
Total Protein: 7 g/dL (ref 6.1–8.1)

## 2017-01-31 LAB — HEPATITIS C ANTIBODY
HEP C AB: NONREACTIVE
SIGNAL TO CUT-OFF: 0.01 (ref <1.00)

## 2017-01-31 NOTE — Telephone Encounter (Signed)
Pre Authorization sent to cover my meds.[id number] notified of gen xopenex approval  Outcome  Approvedtoday  Request Reference Number: [id number]. LEVALBUTEROL AER 45/ACT is approved through 04/17/2018. For further questions, call [phone].

## 2017-03-06 ENCOUNTER — Other Ambulatory Visit: Payer: Self-pay | Admitting: Acute Care

## 2017-03-06 DIAGNOSIS — F17210 Nicotine dependence, cigarettes, uncomplicated: Principal | ICD-10-CM

## 2017-03-06 DIAGNOSIS — Z122 Encounter for screening for malignant neoplasm of respiratory organs: Secondary | ICD-10-CM

## 2017-03-10 ENCOUNTER — Telehealth: Payer: Self-pay | Admitting: Acute Care

## 2017-03-13 ENCOUNTER — Encounter: Payer: Medicare Other | Admitting: Acute Care

## 2017-03-13 ENCOUNTER — Inpatient Hospital Stay: Admission: RE | Admit: 2017-03-13 | Payer: Medicare Other | Source: Ambulatory Visit

## 2017-03-13 NOTE — Telephone Encounter (Signed)
Spoke with pt and SDMV and CT was cancelled.  Pt would like me to call her back in a few weeks to reschedule.  Will defer till that time.

## 2017-04-10 ENCOUNTER — Other Ambulatory Visit: Payer: Self-pay | Admitting: Family Medicine

## 2017-07-31 ENCOUNTER — Encounter: Payer: Self-pay | Admitting: Family Medicine

## 2017-07-31 ENCOUNTER — Ambulatory Visit (INDEPENDENT_AMBULATORY_CARE_PROVIDER_SITE_OTHER): Payer: Medicare Other | Admitting: Family Medicine

## 2017-07-31 VITALS — BP 138/76 | HR 75 | Ht 61.0 in | Wt 95.0 lb

## 2017-07-31 DIAGNOSIS — M810 Age-related osteoporosis without current pathological fracture: Secondary | ICD-10-CM

## 2017-07-31 DIAGNOSIS — I10 Essential (primary) hypertension: Secondary | ICD-10-CM

## 2017-07-31 DIAGNOSIS — M5431 Sciatica, right side: Secondary | ICD-10-CM

## 2017-07-31 DIAGNOSIS — J449 Chronic obstructive pulmonary disease, unspecified: Secondary | ICD-10-CM

## 2017-07-31 DIAGNOSIS — Z720 Tobacco use: Secondary | ICD-10-CM | POA: Diagnosis not present

## 2017-07-31 DIAGNOSIS — F321 Major depressive disorder, single episode, moderate: Secondary | ICD-10-CM | POA: Diagnosis not present

## 2017-07-31 LAB — BASIC METABOLIC PANEL WITH GFR
BUN/Creatinine Ratio: 20 (calc) (ref 6–22)
BUN: 20 mg/dL (ref 7–25)
CALCIUM: 10.2 mg/dL (ref 8.6–10.4)
CHLORIDE: 105 mmol/L (ref 98–110)
CO2: 30 mmol/L (ref 20–32)
Creat: 0.98 mg/dL — ABNORMAL HIGH (ref 0.60–0.93)
GFR, Est African American: 66 mL/min/{1.73_m2} (ref >=60)
GFR, Est Non African American: 57 mL/min/{1.73_m2} — ABNORMAL LOW (ref >=60)
GLUCOSE: 101 mg/dL — AB (ref 65–99)
POTASSIUM: 4.2 mmol/L (ref 3.5–5.3)
Sodium: 141 mmol/L (ref 135–146)

## 2017-07-31 MED ORDER — PREDNISONE 20 MG PO TABS: 40.0000 mg | ORAL_TABLET | Freq: Every day | ORAL | 0 refills | Status: DC

## 2017-07-31 NOTE — Patient Instructions (Addendum)
He is restart calcium with vitamin D.   Sciatica Sciatica is pain, numbness, weakness, or tingling along your sciatic nerve. The sciatic nerve starts in the lower back and goes down the back of each leg. Sciatica happens when this nerve is pinched or has pressure put on it. Sciatica usually goes away on its own or with treatment. Sometimes, sciatica may keep coming back (recur). Follow these instructions at home: Medicines  Take over-the-counter and prescription medicines only as told by your doctor.  Do not drive or use heavy machinery while taking prescription pain medicine. Managing pain  If directed, put ice on the affected area. ? Put ice in a plastic bag. ? Place a towel between your skin and the bag. ? Leave the ice on for 20 minutes, 2-3 times a day.  After icing, apply heat to the affected area before you exercise or as often as told by your doctor. Use the heat source that your doctor tells you to use, such as a moist heat pack or a heating pad. ? Place a towel between your skin and the heat source. ? Leave the heat on for 20-30 minutes. ? Remove the heat if your skin turns bright red. This is especially important if you are unable to feel pain, heat, or cold. You may have a greater risk of getting burned. Activity  Return to your normal activities as told by your doctor. Ask your doctor what activities are safe for you. ? Avoid activities that make your sciatica worse.  Take short rests during the day. Rest in a lying or standing position. This is usually better than sitting to rest. ? When you rest for a long time, do some physical activity or stretching between periods of rest. ? Avoid sitting for a long time without moving. Get up and move around at least one time each hour.  Exercise and stretch regularly, as told by your doctor.  Do not lift anything that is heavier than 10 lb (4.5 kg) while you have symptoms of sciatica. ? Avoid lifting heavy things even when you do  not have symptoms. ? Avoid lifting heavy things over and over.  When you lift objects, always lift in a way that is safe for your body. To do this, you should: ? Bend your knees. ? Keep the object close to your body. ? Avoid twisting. General instructions  Use good posture. ? Avoid leaning forward when you are sitting. ? Avoid hunching over when you are standing.  Stay at a healthy weight.  Wear comfortable shoes that support your feet. Avoid wearing high heels.  Avoid sleeping on a mattress that is too soft or too hard. You might have less pain if you sleep on a mattress that is firm enough to support your back.  Keep all follow-up visits as told by your doctor. This is important. Contact a doctor if:  You have pain that: ? Wakes you up when you are sleeping. ? Gets worse when you lie down. ? Is worse than the pain you have had in the past. ? Lasts longer than 4 weeks.  You lose weight for without trying. Get help right away if:  You cannot control when you pee (urinate) or poop (have a bowel movement).  You have weakness in any of these areas and it gets worse. ? Lower back. ? Lower belly (pelvis). ? Butt (buttocks). ? Legs.  You have redness or swelling of your back.  You have a burning feeling when  you pee. This information is not intended to replace advice given to you by your health care provider. Make sure you discuss any questions you have with your health care provider. Document Released: 01/12/2008 Document Revised: 09/10/2015 Document Reviewed: 12/12/2014 Elsevier Interactive Patient Education  2018 [place].  Sciatica Sciatica is pain, numbness, weakness, or tingling along your sciatic nerve. The sciatic nerve starts in the lower back and goes down the back of each leg. Sciatica happens when this nerve is pinched or has pressure put on it. Sciatica usually goes away on its own or with treatment. Sometimes, sciatica may keep coming back (recur). Follow  these instructions at home: Medicines  Take over-the-counter and prescription medicines only as told by your doctor.  Do not drive or use heavy machinery while taking prescription pain medicine. Managing pain  If directed, put ice on the affected area. ? Put ice in a plastic bag. ? Place a towel between your skin and the bag. ? Leave the ice on for 20 minutes, 2-3 times a day.  After icing, apply heat to the affected area before you exercise or as often as told by your doctor. Use the heat source that your doctor tells you to use, such as a moist heat pack or a heating pad. ? Place a towel between your skin and the heat source. ? Leave the heat on for 20-30 minutes. ? Remove the heat if your skin turns bright red. This is especially important if you are unable to feel pain, heat, or cold. You may have a greater risk of getting burned. Activity  Return to your normal activities as told by your doctor. Ask your doctor what activities are safe for you. ? Avoid activities that make your sciatica worse.  Take short rests during the day. Rest in a lying or standing position. This is usually better than sitting to rest. ? When you rest for a long time, do some physical activity or stretching between periods of rest. ? Avoid sitting for a long time without moving. Get up and move around at least one time each hour.  Exercise and stretch regularly, as told by your doctor.  Do not lift anything that is heavier than 10 lb (4.5 kg) while you have symptoms of sciatica. ? Avoid lifting heavy things even when you do not have symptoms. ? Avoid lifting heavy things over and over.  When you lift objects, always lift in a way that is safe for your body. To do this, you should: ? Bend your knees. ? Keep the object close to your body. ? Avoid twisting. General instructions  Use good posture. ? Avoid leaning forward when you are sitting. ? Avoid hunching over when you are standing.  Stay at a  healthy weight.  Wear comfortable shoes that support your feet. Avoid wearing high heels.  Avoid sleeping on a mattress that is too soft or too hard. You might have less pain if you sleep on a mattress that is firm enough to support your back.  Keep all follow-up visits as told by your doctor. This is important. Contact a doctor if:  You have pain that: ? Wakes you up when you are sleeping. ? Gets worse when you lie down. ? Is worse than the pain you have had in the past. ? Lasts longer than 4 weeks.  You lose weight for without trying. Get help right away if:  You cannot control when you pee (urinate) or poop (have a bowel movement).  You have weakness in any of these areas and it gets worse. ? Lower back. ? Lower belly (pelvis). ? Butt (buttocks). ? Legs.  You have redness or swelling of your back.  You have a burning feeling when you pee. This information is not intended to replace advice given to you by your health care provider. Make sure you discuss any questions you have with your health care provider. Document Released: 01/12/2008 Document Revised: 09/10/2015 Document Reviewed: 12/12/2014 Elsevier Interactive Patient Education  [employer].

## 2017-07-31 NOTE — Progress Notes (Signed)
Subjective:    CC: CP, COPD, mood  HPI:  Hypertension- Pt denies chest pain, SOB, dizziness, or heart palpitations.  Taking meds as directed w/o problems.  Denies medication side effects.  Currently on lisinopril and amlodipine.  F/U COPD -she has not used her rescue inhaler recently but has noticed a little bit more cough and shortness of breath since the pollens been high.  Her cough is normally dry though when she does have a chronic cough from smoking.    Tobacco abuse-she still continues to smoke daily but is down to half a pack per day.  F/U depression  -currently on Cymbalta 30 mg daily and overall doing well.  Denies currently feeling down depressed or hopeless.  Also complains of right sided leg pain radiating from her hip downward.  She says originally she was having pain mostly around that right ankle and that kind of gradually got better and as it did she started noticing pain over her right buttock area radiating down the right outer leg almost down to her ankle and foot.  It is more noticeable when she turns or twists when she is putting her seatbelt on in the car.  She denies any injury or trauma.  She is not currently taking any medication for it.  Past medical history, Surgical history, Family history not pertinant except as noted below, Social history, Allergies, and medications have been entered into the medical record, reviewed, and corrections made.   Review of Systems: No fevers, chills, night sweats, weight loss, chest pain, or shortness of breath.   Objective:    General: Well Developed, well nourished, and in no acute distress.  Neuro: Alert and oriented x3, extra-ocular muscles intact, sensation grossly intact.  HEENT: Normocephalic, atraumatic  Skin: Warm and dry, no rashes. Cardiac: Regular rate and rhythm, no murmurs rubs or gallops, no lower extremity edema.  Respiratory: Clear to auscultation bilaterally. Not using accessory muscles, speaking in full  sentences. MSK: she has some kyphosis of the thoracic spine.  Hips, knee, ankle strength is 5 out of 5 bilaterally.  Positive straight leg raise on the right.  Tender just lateral to the right SI joint.   Impression and Recommendations:    HTN - Well controlled. Continue current regimen. Follow up in 6 months.    COPD -stable.  Did encourage her to use her albuterol when needed.  Chronic depression-PHQ 9 score of 4.    Osteoporosis-needs bone denisty.  Did encourage her to restart her calcium with vitamin D.  Sciatica, right-given handout on stretches to do on her own.  She can certainly fill the prednisone if she would like.  If not improving then please let us know and we can always start with a x-ray of the lumbar spine.  Tobacco abuse- she is down to half a pack a days.  We discussed continuing to wean her cigarette use.  We also discussed the options of prescription medication but she said that made her a little nervous.

## 2017-08-01 ENCOUNTER — Ambulatory Visit (INDEPENDENT_AMBULATORY_CARE_PROVIDER_SITE_OTHER): Payer: Medicare Other

## 2017-08-01 DIAGNOSIS — M810 Age-related osteoporosis without current pathological fracture: Secondary | ICD-10-CM

## 2017-08-01 DIAGNOSIS — Z780 Asymptomatic menopausal state: Secondary | ICD-10-CM | POA: Diagnosis not present

## 2017-08-01 NOTE — Progress Notes (Signed)
All labs are normal. 

## 2017-08-04 ENCOUNTER — Other Ambulatory Visit: Payer: Self-pay | Admitting: Family Medicine

## 2017-08-07 ENCOUNTER — Other Ambulatory Visit: Payer: Self-pay | Admitting: *Deleted

## 2017-08-07 MED ORDER — DULOXETINE HCL 30 MG PO CPEP: 30.0000 mg | ORAL_CAPSULE | Freq: Every day | ORAL | 1 refills | Status: DC

## 2017-09-16 DIAGNOSIS — S63502A Unspecified sprain of left wrist, initial encounter: Secondary | ICD-10-CM | POA: Diagnosis not present

## 2017-09-16 DIAGNOSIS — M25532 Pain in left wrist: Secondary | ICD-10-CM | POA: Diagnosis not present

## 2017-10-02 ENCOUNTER — Ambulatory Visit (INDEPENDENT_AMBULATORY_CARE_PROVIDER_SITE_OTHER): Payer: Medicare Other

## 2017-10-02 ENCOUNTER — Ambulatory Visit (INDEPENDENT_AMBULATORY_CARE_PROVIDER_SITE_OTHER): Payer: Medicare Other | Admitting: Sports Medicine

## 2017-10-02 ENCOUNTER — Encounter: Payer: Self-pay | Admitting: Sports Medicine

## 2017-10-02 DIAGNOSIS — W19XXXA Unspecified fall, initial encounter: Secondary | ICD-10-CM

## 2017-10-02 DIAGNOSIS — S6992XA Unspecified injury of left wrist, hand and finger(s), initial encounter: Secondary | ICD-10-CM

## 2017-10-02 DIAGNOSIS — S52502A Unspecified fracture of the lower end of left radius, initial encounter for closed fracture: Secondary | ICD-10-CM

## 2017-10-02 DIAGNOSIS — S5292XA Unspecified fracture of left forearm, initial encounter for closed fracture: Secondary | ICD-10-CM | POA: Insufficient documentation

## 2017-10-02 DIAGNOSIS — S52532A Colles' fracture of left radius, initial encounter for closed fracture: Secondary | ICD-10-CM

## 2017-10-02 DIAGNOSIS — S52325A Nondisplaced transverse fracture of shaft of left radius, initial encounter for closed fracture: Secondary | ICD-10-CM | POA: Diagnosis not present

## 2017-10-02 NOTE — Assessment & Plan Note (Addendum)
Distal radius fracture with some mild shortening of the radial height. Short arm cast placed, this is her initial presentation, she fell about 2-1/2 weeks ago. Continue short arm cast for 4 weeks, return in 4 weeks for cast removal. I would like her to work with her PCP on smoking cessation.   I billed a fracture code for this encounter, all subsequent visits will be post-op checks in the global period.

## 2017-10-02 NOTE — Progress Notes (Signed)
Subjective:    CC: Left wrist pain  HPI: 2-1/2 weeks ago this pleasant 72 year old female fell onto an outstretched left hand, she had immediate pain, swelling, bruising.  She was seen in urgent care and placed in a Velcro wrist brace, she tells [place] that x-rays were negative for fracture.  Pain is moderate, persistent.  She is an avid smoker.  I reviewed the past medical history, family history, social history, surgical history, and allergies today and no changes were needed.  Please see the problem list section below in epic for further details.  Past Medical History: Past Medical History:  Diagnosis Date  . Arthritis   . Emphysema of lung ([place])   . Hyperlipidemia   . Hypertension   . Kyphosis of thoracic region   . Osteoporosis   . Thyroid disease    Hx of goiter  . Tobacco dependence    Past Surgical History: Past Surgical History:  Procedure Laterality Date  . BARTHOLIN GLAND CYST EXCISION    . FNA cyst breast     left   Social History: Social History   Socioeconomic History  . Marital status: Divorced    Spouse name: Not on file  . Number of children: Not on file  . Years of education: Not on file  . Highest education level: Not on file  Occupational History  . Occupation: Investment banker, operational  . Financial resource strain: Not on file  . Food insecurity:    Worry: Not on file    Inability: Not on file  . Transportation needs:    Medical: Not on file    Non-medical: Not on file  Tobacco Use  . Smoking status: Current Every Day Smoker    Packs/day: 1.00    Types: Cigarettes  . Smokeless tobacco: Never Used  Substance and Sexual Activity  . Alcohol use: No    Comment: h/o alcohol abuse  . Drug use: No  . Sexual activity: Never    Comment: works at [employer], no reg exercise  Lifestyle  . Physical activity:    Days per week: Not on file    Minutes per session: Not on file  . Stress: Not on file  Relationships  . Social connections:    Talks  on phone: Not on file    Gets together: Not on file    Attends religious service: Not on file    Active member of club or organization: Not on file    Attends meetings of clubs or organizations: Not on file    Relationship status: Not on file  Other Topics Concern  . Not on file  Social History Narrative   Divorced, no children.     Occupation: works at [employer] 3 days a week.   Orig from [place], [place].   Hobby: crochet, puzzles   Tob: 100 pack/yr history.  Alcohol: rare now.  Abused alcohol in the past for years.   No drugs.   Coffee 5 cups per day.   Family History: Family History  Problem Relation Age of Onset  . Heart attack Mother   . Cancer Mother        Brain  . Diabetes Mother   . Hypertension Mother   . Alcohol abuse Father   . Lung cancer Father        smoker  . Hyperlipidemia Sister    Allergies: Allergies  Allergen Reactions  . Hydrochlorothiazide Other (See Comments)    Inc BUN/CR  .  Lovastatin Other (See Comments)    Myalgias  . Pravastatin Other (See Comments)    Myalgias    Medications: See med rec.  Review of Systems: No fevers, chills, night sweats, weight loss, chest pain, or shortness of breath.   Objective:    General: Well Developed, well nourished, and in no acute distress.  Neuro: Alert and oriented x3, extra-ocular muscles intact, sensation grossly intact.  HEENT: Normocephalic, atraumatic, pupils equal round reactive to light, neck supple, no masses, no lymphadenopathy, thyroid nonpalpable.  Skin: Warm and dry, no rashes. Cardiac: Regular rate and rhythm, no murmurs rubs or gallops, no lower extremity edema.  Respiratory: Clear to auscultation bilaterally. Not using accessory muscles, speaking in full sentences. Left wrist: Swollen. ROM smooth and normal with good flexion and extension and ulnar/radial deviation that is symmetrical with opposite wrist. Palpation is normal over metacarpals, navicular, lunate, and TFCC;  tendons without tenderness/ swelling No snuffbox tenderness. No tenderness over Canal of [place]. Strength 5/5 in all directions without pain. Negative tinel's and phalens signs. Negative Finkelstein sign. Negative Watson's test. Tender to palpation over the dorsal distal radius.  X-rays do show an acute distal radius fracture, minimally displaced with mild shortening of the radial height  Short arm cast placed.  Impression and Recommendations:    Fracture of radius, left, closed Distal radius fracture with some mild shortening of the radial height. Short arm cast placed, this is her initial presentation, she fell about 2-1/2 weeks ago. Continue short arm cast for 4 weeks, return in 4 weeks for cast removal. I would like her to work with her PCP on smoking cessation.   I billed a fracture code for this encounter, all subsequent visits will be post-op checks in the global period. ___________________________________________ Gwen Her. Dianah Field, M.D., ABFM., CAQSM. Primary Care and [place] Instructor of [place] [facility]

## 2017-10-30 ENCOUNTER — Encounter: Payer: Self-pay | Admitting: Sports Medicine

## 2017-10-30 ENCOUNTER — Ambulatory Visit (INDEPENDENT_AMBULATORY_CARE_PROVIDER_SITE_OTHER): Payer: Medicare Other

## 2017-10-30 ENCOUNTER — Ambulatory Visit (INDEPENDENT_AMBULATORY_CARE_PROVIDER_SITE_OTHER): Payer: Medicare Other | Admitting: Sports Medicine

## 2017-10-30 DIAGNOSIS — S52532D Colles' fracture of left radius, subsequent encounter for closed fracture with routine healing: Secondary | ICD-10-CM | POA: Diagnosis not present

## 2017-10-30 DIAGNOSIS — W19XXXD Unspecified fall, subsequent encounter: Secondary | ICD-10-CM | POA: Diagnosis not present

## 2017-10-30 DIAGNOSIS — S52592D Other fractures of lower end of left radius, subsequent encounter for closed fracture with routine healing: Secondary | ICD-10-CM | POA: Diagnosis not present

## 2017-10-30 NOTE — Progress Notes (Signed)
  Subjective: This is a pleasant 72 year old female, she returns for follow-up of her wrist fracture, diagnosed for 2 weeks, she has been in a short arm cast for 4 weeks, doing well.  Objective: General: Well-developed, well-nourished, and in no acute distress. Left wrist: Cast is removed, no tenderness over the fracture.  Lack of range of motion as expected.  Assessment/plan:   Fracture of radius, left, closed Cast removed today, repeat x-rays. Return in 4 weeks to reevaluate. ___________________________________________ Gwen Her. Dianah Field, M.D., ABFM., CAQSM. Primary Care and [place] Instructor of [place] [facility]

## 2017-10-30 NOTE — Assessment & Plan Note (Addendum)
Cast removed today, repeat x-rays. Return in 4 weeks to reevaluate.

## 2017-10-31 ENCOUNTER — Other Ambulatory Visit: Payer: Self-pay | Admitting: Family Medicine

## 2017-11-30 ENCOUNTER — Encounter: Payer: Self-pay | Admitting: Sports Medicine

## 2017-11-30 ENCOUNTER — Ambulatory Visit (INDEPENDENT_AMBULATORY_CARE_PROVIDER_SITE_OTHER): Payer: Medicare Other | Admitting: Sports Medicine

## 2017-11-30 ENCOUNTER — Ambulatory Visit (INDEPENDENT_AMBULATORY_CARE_PROVIDER_SITE_OTHER): Payer: Medicare Other | Admitting: Family Medicine

## 2017-11-30 ENCOUNTER — Encounter: Payer: Self-pay | Admitting: Family Medicine

## 2017-11-30 VITALS — BP 148/84 | HR 100 | Ht 61.0 in | Wt 96.0 lb

## 2017-11-30 DIAGNOSIS — I10 Essential (primary) hypertension: Secondary | ICD-10-CM

## 2017-11-30 DIAGNOSIS — J449 Chronic obstructive pulmonary disease, unspecified: Secondary | ICD-10-CM | POA: Diagnosis not present

## 2017-11-30 DIAGNOSIS — R1084 Generalized abdominal pain: Secondary | ICD-10-CM | POA: Diagnosis not present

## 2017-11-30 DIAGNOSIS — Z720 Tobacco use: Secondary | ICD-10-CM | POA: Diagnosis not present

## 2017-11-30 DIAGNOSIS — G4485 Primary stabbing headache: Secondary | ICD-10-CM | POA: Diagnosis not present

## 2017-11-30 DIAGNOSIS — S52532D Colles' fracture of left radius, subsequent encounter for closed fracture with routine healing: Secondary | ICD-10-CM

## 2017-11-30 MED ORDER — AMLODIPINE BESYLATE 5 MG PO TABS: 5.0000 mg | ORAL_TABLET | Freq: Every day | ORAL | 0 refills | Status: DC

## 2017-11-30 NOTE — Assessment & Plan Note (Signed)
8 weeks post fracture, doing well, pain-free, range of motion is now symmetric with the contralateral side. Return to see me as needed.

## 2017-11-30 NOTE — Progress Notes (Signed)
  Subjective: 8 weeks post fracture, doing well.  Objective: General: Well-developed, well-nourished, and in no acute distress. Left wrist: Only slightly full appearing without any tenderness over the fracture. ROM smooth and normal with good flexion and extension and ulnar/radial deviation that is symmetrical with opposite wrist. Palpation is normal over metacarpals, navicular, lunate, and TFCC; tendons without tenderness/ swelling No snuffbox tenderness. No tenderness over Canal of Gu[place]. Strength 5/5 in all directions without pain. Negative tinel's and phalens signs. Negative Finkelstein sign. Negative Watson's test.  Assessment/plan:   Fracture of radius, left, closed 8 weeks post fracture, doing well, pain-free, range of motion is now symmetric with the contralateral side. Return to see me as needed. ___________________________________________ Gwen Her. Dianah Field, M.D., ABFM., CAQSM. Primary Care and [place] Instructor of [place] [facility]
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
## 2017-11-30 NOTE — Patient Instructions (Addendum)
Ok to stop aspirin.  If your stomach is not feeling better after stopping the aspirin then please let me know. I increased your amlodipine to 10mg  a day.

## 2017-12-25 ENCOUNTER — Other Ambulatory Visit: Payer: Self-pay | Admitting: Family Medicine

## 2017-12-25 DIAGNOSIS — Z1231 Encounter for screening mammogram for malignant neoplasm of breast: Secondary | ICD-10-CM

## 2017-12-28 ENCOUNTER — Encounter: Payer: Self-pay | Admitting: Family Medicine

## 2017-12-28 ENCOUNTER — Ambulatory Visit (INDEPENDENT_AMBULATORY_CARE_PROVIDER_SITE_OTHER): Payer: Medicare Other | Admitting: Family Medicine

## 2017-12-28 ENCOUNTER — Ambulatory Visit (INDEPENDENT_AMBULATORY_CARE_PROVIDER_SITE_OTHER): Payer: Medicare Other

## 2017-12-28 VITALS — BP 140/72 | HR 68 | Ht 61.0 in | Wt 94.0 lb

## 2017-12-28 DIAGNOSIS — Z1231 Encounter for screening mammogram for malignant neoplasm of breast: Secondary | ICD-10-CM | POA: Diagnosis not present

## 2017-12-28 DIAGNOSIS — I10 Essential (primary) hypertension: Secondary | ICD-10-CM | POA: Diagnosis not present

## 2017-12-28 DIAGNOSIS — N6320 Unspecified lump in the left breast, unspecified quadrant: Secondary | ICD-10-CM | POA: Diagnosis not present

## 2017-12-28 DIAGNOSIS — J449 Chronic obstructive pulmonary disease, unspecified: Secondary | ICD-10-CM | POA: Diagnosis not present

## 2017-12-28 DIAGNOSIS — R928 Other abnormal and inconclusive findings on diagnostic imaging of breast: Secondary | ICD-10-CM | POA: Diagnosis not present

## 2017-12-28 DIAGNOSIS — F5101 Primary insomnia: Secondary | ICD-10-CM
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
## 2018-01-01 ENCOUNTER — Other Ambulatory Visit: Payer: Self-pay | Admitting: Family Medicine

## 2018-01-01 DIAGNOSIS — R928 Other abnormal and inconclusive findings on diagnostic imaging of breast: Secondary | ICD-10-CM

## 2018-01-19 ENCOUNTER — Other Ambulatory Visit: Payer: Self-pay | Admitting: Family Medicine

## 2018-02-02 ENCOUNTER — Other Ambulatory Visit: Payer: Self-pay | Admitting: Family Medicine

## 2018-02-20 ENCOUNTER — Other Ambulatory Visit: Payer: Self-pay | Admitting: Family Medicine

## 2018-05-16 ENCOUNTER — Other Ambulatory Visit: Payer: Self-pay | Admitting: Family Medicine
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
## 2018-05-22 ENCOUNTER — Ambulatory Visit (INDEPENDENT_AMBULATORY_CARE_PROVIDER_SITE_OTHER): Payer: Medicare Other | Admitting: *Deleted

## 2018-05-22 VITALS — BP 160/60 | HR 89 | Ht 61.0 in | Wt 95.8 lb

## 2018-05-22 DIAGNOSIS — Z0000 Encounter for general adult medical examination without abnormal findings: Secondary | ICD-10-CM | POA: Diagnosis not present

## 2018-05-22 NOTE — Patient Instructions (Signed)
Ms. Janet Aguirre , Thank you for taking time to come for your Medicare Wellness Visit. I appreciate your ongoing commitment to your health goals. Please review the following plan we discussed and let me know if I can assist you in the future.  Please schedule your next medicare wellness visit with me in 1 yr.  These are the goals we discussed: Goals    . DIET - EAT MORE FRUITS AND VEGETABLES     Start eating more food a day with healthy choices.     High-Protein and High-Calorie Diet Eating high-protein and high-calorie foods can help you to gain weight, heal after an injury, and recover after an illness or surgery. The specific amount of daily protein and calories you need depends on:  Your body weight.  The reason this diet is recommended for you. What is my plan? Generally, a high-protein, high-calorie diet involves:  Eating 250-500 extra calories each day.  Making sure that you get enough of your daily calories from protein. Ask your health care provider how many of your calories should come from protein. Talk with a health care provider, such as a diet and nutrition specialist (dietitian), about how much protein and how many calories you need each day. Follow the diet as directed by your health care provider. What are tips for following this plan?  Preparing meals  Add whole milk, half-and-half, or heavy cream to cereal, pudding, soup, or hot cocoa.  Add whole milk to instant breakfast drinks.  Add peanut butter to oatmeal or smoothies.  Add powdered milk to baked goods, smoothies, or milkshakes.  Add powdered milk, cream, or butter to mashed potatoes.  Add cheese to cooked vegetables.  Make whole-milk yogurt parfaits. Top them with granola, fruit, or nuts.  Add cottage cheese to your fruit.  Add avocado, cheese, or both to sandwiches or salads.  Add meat, poultry, or seafood to rice, pasta, casseroles, salads, and soups.  Use mayonnaise when making egg salad, chicken  salad, or tuna salad.  Use peanut butter as a dip for vegetables or as a topping for pretzels, celery, or crackers.  Add beans to casseroles, dips, and spreads.  Add pureed beans to sauces and soups.  Replace calorie-free drinks with calorie-containing drinks, such as milk and fruit juice.  Replace water with milk or heavy cream when making foods such as oatmeal, pudding, or cocoa. General instructions  Ask your health care provider if you should take a nutritional supplement.  Try to eat six small meals each day instead of three large meals.  Eat a balanced diet. In each meal, include one food that is high in protein.  Keep nutritious snacks available, such as nuts, trail mixes, dried fruit, and yogurt.  If you have kidney disease or diabetes, talk with your health care provider about how much protein is safe for you. Too much protein may put extra stress on your kidneys.  Drink your calories. Choose high-calorie drinks and have them after your meals. What high-protein foods should I eat?  Vegetables Soybeans. Peas. Grains Quinoa. Bulgur wheat. Meats and other proteins Beef, pork, and poultry. Fish and seafood. Eggs. Tofu. Textured vegetable protein (TVP). Peanut butter. Nuts and seeds. Dried beans. Protein powders. Dairy Whole milk. Whole-milk yogurt. Powdered milk. Cheese. Yahoo. Eggnog. Beverages High-protein supplement drinks. Soy milk. Other foods Protein bars. The items listed above may not be a complete list of high-protein foods and beverages. Contact a dietitian for more options. What high-calorie foods should I  eat? Fruits Dried fruit. Fruit leather. Canned fruit in syrup. Fruit juice. Avocado. Vegetables Vegetables cooked in oil or butter. Fried potatoes. Grains Pasta. Quick breads. Muffins. Pancakes. Ready-to-eat cereal. Meats and other proteins Peanut butter. Nuts and seeds. Dairy Heavy cream. Whipped cream. Cream cheese. Sour cream. Ice cream.  Custard. Pudding. Beverages Meal-replacement beverages. Nutrition shakes. Fruit juice. Sugar-sweetened soft drinks. Seasonings and condiments Salad dressing. Mayonnaise. Alfredo sauce. Fruit preserves or jelly. Honey. Syrup. Sweets and desserts Cake. Cookies. Pie. Pastries. Candy bars. Chocolate. Fats and oils Butter or margarine. Oil. Gravy. Other foods Meal-replacement bars. The items listed above may not be a complete list of high-calorie foods and beverages. Contact a dietitian for more options. Summary  A high-protein, high-calorie diet can help you gain weight or heal faster after an injury, illness, or surgery.  To increase your protein and calories, add ingredients such as whole milk, peanut butter, cheese, beans, meat, or seafood to meal items.  To get enough extra calories each day, include high-calorie foods and beverages at each meal.  Adding a high-calorie drink or shake can be an easy way to help you get enough calories each day. Talk with your healthcare provider or dietitian about the best options for you. This information is not intended to replace advice given to you by your health care provider. Make sure you discuss any questions you have with your health care provider. Document Released: 04/04/2005 Document Revised: 02/14/2017 Document Reviewed: 02/14/2017 Elsevier Interactive Patient Education  2019 [employer].

## 2018-05-31 ENCOUNTER — Other Ambulatory Visit: Payer: Self-pay | Admitting: Family Medicine

## 2018-06-28 ENCOUNTER — Ambulatory Visit (INDEPENDENT_AMBULATORY_CARE_PROVIDER_SITE_OTHER): Payer: Medicare Other | Admitting: Family Medicine

## 2018-06-28 ENCOUNTER — Other Ambulatory Visit: Payer: Self-pay

## 2018-06-28 VITALS — BP 138/65 | HR 72 | Ht 61.0 in | Wt 97.0 lb

## 2018-06-28 DIAGNOSIS — I10 Essential (primary) hypertension: Secondary | ICD-10-CM

## 2018-06-28 DIAGNOSIS — J449 Chronic obstructive pulmonary disease, unspecified: Secondary | ICD-10-CM | POA: Diagnosis not present

## 2018-06-28 LAB — COMPLETE METABOLIC PANEL WITH GFR
AG Ratio: 1.7 (calc) (ref 1.0–2.5)
ALT: 8 U/L (ref 6–29)
AST: 16 U/L (ref 10–35)
Albumin: 4.7 g/dL (ref 3.6–5.1)
Alkaline phosphatase (APISO): 52 U/L (ref 37–153)
BUN/Creatinine Ratio: 16 (calc) (ref 6–22)
BUN: 16 mg/dL (ref 7–25)
CALCIUM: 10.4 mg/dL (ref 8.6–10.4)
CO2: 23 mmol/L (ref 20–32)
Chloride: 105 mmol/L (ref 98–110)
Creat: 1.01 mg/dL — ABNORMAL HIGH (ref 0.60–0.93)
GFR, EST NON AFRICAN AMERICAN: 55 mL/min/{1.73_m2} — AB (ref >=60)
GFR, Est African American: 64 mL/min/{1.73_m2} (ref >=60)
Globulin: 2.8 g/dL (calc) (ref 1.9–3.7)
Glucose, Bld: 108 mg/dL — ABNORMAL HIGH (ref 65–99)
Potassium: 4.1 mmol/L (ref 3.5–5.3)
Sodium: 141 mmol/L (ref 135–146)
Total Bilirubin: 0.5 mg/dL (ref 0.2–1.2)
Total Protein: 7.5 g/dL (ref 6.1–8.1)

## 2018-06-28 LAB — LIPID PANEL
Cholesterol: 255 mg/dL — ABNORMAL HIGH (ref <200)
HDL: 53 mg/dL (ref >=50)
LDL Cholesterol (Calc): 175 mg/dL (calc) — ABNORMAL HIGH
NON-HDL CHOLESTEROL (CALC): 202 mg/dL — AB (ref <130)
Total CHOL/HDL Ratio: 4.8 (calc) (ref <5.0)
Triglycerides: 131 mg/dL (ref <150)

## 2018-06-28 MED ORDER — ALBUTEROL SULFATE (2.5 MG/3ML) 0.083% IN NEBU
2.5000 mg | INHALATION_SOLUTION | Freq: Once | RESPIRATORY_TRACT | Status: AC
  Administered 2018-06-28: 2.5 mg via RESPIRATORY_TRACT

## 2018-06-28 NOTE — Patient Outreach (Signed)
[facility]) Care Management  06/28/2018  Janet Aguirre February 06, 1946 [id number]   Telephone Screen  Referral Date: 06/28/2018 Referral Source: MD Office Referral Reason: " assistance with inhaler" Insurance: [facility] Medicare   Outreach attempt back to patient per her previous request. Spoke with patient and screening completed. Patient resides in her home alone. She states she has supportive brother who lives nearby and checks on her. She is independent with ADLs/IADLs. She denies any recent falls or use of assistive devices. Patient voices that she drives herself to medical appts. Per chart review, she has PMH of COPD,HTN,arthritis, HLD, thyroid disease and tobacco dependence. Patient denies any issues or concerns managing her chronic conditions. She states that her breathing has ben managed/controled with prn Albuterol inhaler. She went to PCP appt today and MD wants to put patient on a maintenance inhaler. She states MD did not prescribe one and told her she would have someone(THN) look into the cheapest and most effective inhaler option for her. Patient voices she is taking about five meds currently. She denies any issues affording those meds at this time and is able to manage her meds on her own. [facility] services reviewed and discussed with patient. Verbal consent for services given.   Plan: RN CM will send [facility] referral for med assistance.  Enzo Montgomery, RN,BSN,CCM [facility] Management Telephonic Care Management Coordinator Direct Phone: [phone] Toll Free: [phone] Fax: [phone]

## 2018-06-28 NOTE — Patient Outreach (Signed)
[facility]) Care Management  06/28/2018  Janet Aguirre 04-27-1945 [id number]   Telephone Screen  Referral Date: 06/28/2018 Referral Source: MD Office Referral Reason: " assistance with inhaler" Insurance: [facility] Medicare   Outreach attempt #1 to patient. Spoke with patient who reported she was in the car and requested call back later.     Plan: RN CM will make outreach attempt to patient within 3-4 business days.   Enzo Montgomery, RN,BSN,CCM [facility] Management Telephonic Care Management Coordinator Direct Phone: [phone] Toll Free: [phone] Fax: [phone]

## 2018-06-28 NOTE — Assessment & Plan Note (Signed)
Well controlled. Continue current regimen. Follow up in  6 months. Due for labs.   
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
## 2018-06-28 NOTE — Assessment & Plan Note (Signed)
06/28/2018: FVC of 81%, FEV1 of 55% with a ratio of 50. 28% improvement in FEF 25-75 after albuterol treatment.  This consistent with moderate COPD.  I would really like to get her on an anticholinergic controller.  But right now she is very financially strapped and is having difficulty and does not think she would be able to afford it.  We also discussed using her Xopenex as a rescue medication and went back over the indications for 1 is actually appropriate to use it.  For example when she gets a little overtaxed and starts to cough that might be a perfect time to use it.  We will see if we can maybe see if she would qualify for some assistance through some of the drug companies.  Otherwise had like to see her back in about 2 months if we are able to get her on a controller.  If not then I will see her for her routine in 6 months.

## 2018-06-29 ENCOUNTER — Ambulatory Visit: Payer: Self-pay | Admitting: Pharmacist

## 2018-06-29 MED ORDER — DULOXETINE HCL 60 MG PO CPEP: 60.0000 mg | ORAL_CAPSULE | Freq: Every day | ORAL | 1 refills | Status: DC

## 2018-06-29 NOTE — Addendum Note (Signed)
Addended by: [name] D on: 06/29/2018 09:59 AM   Modules accepted: Orders

## 2018-07-03 ENCOUNTER — Ambulatory Visit: Payer: Self-pay | Admitting: Pharmacist

## 2018-07-04 ENCOUNTER — Other Ambulatory Visit: Payer: Self-pay | Admitting: Pharmacist

## 2018-07-04 NOTE — Patient Outreach (Addendum)
[facility]) Care Management  [place]   07/04/2018  Janet Aguirre August 10, 1945 [id number]  Reason for referral: Medication Assistance with inhalers  Referral source: [place]  PMHx includes but not limited to:  COPD, HTN, HLD (statin intolerant), osteoporosis  Outreach:  Successful telephone call with Janet Aguirre.  HIPAA identifiers verified.  Patient agreeable to review medications telephonically.  Patient has been unable to afford certain medications (inhalers).  PCP has been wanting to start a LAMA for COPD/emphysema as her symptoms have been worsening.  Patient agreeable to pursue patient assistance program for medications. When asked about statin for HLD, patient notes that she is unable to take a statin due to myopathy.  She states she will start taking her fish oil again. No other pharmacy concerns/needs identified.  Objective: Lab Results  Component Value Date   CREATININE 1.01 (H) 06/28/2018   CREATININE 0.98 (H) 07/31/2017   CREATININE 0.93 01/30/2017    No results found for: HGBA1C  Lipid Panel     Component Value Date/Time   CHOL 255 (H) 06/28/2018 1132   TRIG 131 06/28/2018 1132   HDL 53 06/28/2018 1132   CHOLHDL 4.8 06/28/2018 1132   VLDL 18 01/06/2016 0951   LDLCALC 175 (H) 06/28/2018 1132   LDLDIRECT 194.5 05/22/2013 0911    BP Readings from Last 3 Encounters:  06/28/18 138/65  05/22/18 (!) 160/60  12/28/17 140/72    Allergies  Allergen Reactions  . Hydrochlorothiazide Other (See Comments)    Inc BUN/CR  . Lovastatin Other (See Comments)    Myalgias  . Pravastatin Other (See Comments)    Myalgias     Medications Reviewed Today    Reviewed by Lavera Guise, [facility] (Pharmacist) on 07/04/18 at [phone]  Med List Status: <None>  Medication Order Taking? Sig Documenting Provider Last Dose Status Informant  alendronate (FOSAMAX) 70 MG tablet [id number] Yes TAKE 1 TABLET BY MOUTH EVERY 7 DAYS WITH  A FULL GLASS OF WATER ON AN EMPTY STOMACH Hali Marry, MD Taking Active   amLODipine (NORVASC) 5 MG tablet [id number] Yes TAKE 1 TABLET BY MOUTH ONCE DAILY Hali Marry, MD Taking Active   CALCIUM-VITAMIN D PO [id number] Yes Take 1 tablet by mouth 2 (two) times daily. [provider] Taking Active   DULoxetine (CYMBALTA) 60 MG capsule [id number] Yes Take 1 capsule (60 mg total) by mouth daily. Hali Marry, MD Taking Active   levalbuterol [facility]) 45 MCG/ACT inhaler [id number]  Inhale 2 puffs into the lungs every 4 (four) hours as needed for wheezing.  Patient not taking:  Reported on 05/22/2018   Hali Marry, MD  Active   lisinopril (PRINIVIL,ZESTRIL) 20 MG tablet [id number]  TAKE 2 TABLETS BY MOUTH ONCE DAILY Hali Marry, MD  Active   loratadine (CLARITIN) 10 MG tablet [id number]  Take 10 mg by mouth daily as needed for allergies. [provider]  Active           Assessment:  Drugs sorted by system:  Neurologic/Psychologic: duloxetine  Cardiovascular: lisinopril, amlodipine  Pulmonary/Allergy: levalbuterol, loratadine  Vitamins/Minerals/Supplements: calcium/vitD  Miscellaneous: alendronate  Medication Assistance Findings:   Extra Help:   []  Already receiving Full Extra Help  []  Already receiving Partial Extra Help  []  Eligible based on reported income and assets  [x]  Not Eligible based on reported income and assets  Patient Assistance Programs: 1) Spiriva Respimat made by [employer] o Income requirement met: []  Yes []   No []  Unknown o Out-of-pocket prescription expenditure met:    []  Yes []  No  []  Unknown  []  Not applicable - Patient has met application requirements to apply for this patient assistance program.           2)  Proventil made by [employer]  o Income requirement met: []  Yes []  No  []  Unknown o Out-of-pocket prescription expenditure met:   []  Yes []  No   []  Unknown []  Not applicable - Patient has met  application requirements to apply for this patient assistance program.    Plan: I will route patient assistance letter to [place] technician who will coordinate patient assistance program application process for medications listed above.  [facility] pharmacy technician will assist with obtaining all required documents from both patient and provider(s) and submit application(s) once completed.

## 2018-07-06 ENCOUNTER — Other Ambulatory Visit: Payer: Self-pay | Admitting: Pharmacy Technician

## 2018-07-06 NOTE — Patient Outreach (Signed)
[facility]) Care Management  07/06/2018  COURTLYNN HOLLOMAN 23-Aug-1945 [id number]                          Medication Assistance Referral  Referral From: [facility] RPh Jenne Pane.  Medication/Company: Spiriva Respimat / B-I Patient application portion:  Mailed Provider application portion: Faxed  to Dr. Madilyn Fireman  Medication/Company: Proventil / [employer]  Patient application portion:  Mailed Provider application portion: Interoffice Mailed to Dr. Madilyn Fireman  Follow up:  Will follow up with patient in 7-10 business days to confirm application(s) have been received.  Maud Deed Chana Bode [place] Certified Pharmacy Technician [place] Management Direct Dial:[phone]

## 2018-07-30 ENCOUNTER — Other Ambulatory Visit: Payer: Self-pay | Admitting: Pharmacy Technician

## 2018-07-30 NOTE — Patient Outreach (Signed)
[facility]) Care Management  07/30/2018  Janet Aguirre Jan 04, 1946 [id number]    Successful call placed to patient regarding patient assistance application(s) for Spiriva Respimat and Proventil HFA , HIPAA identifiers verified. [name] confirms she received patient assistance applications. She states that she has been busy and will try to take care of filling applications and mailing them this week. Requested that she contact me if she has any questions.  Follow up:  Will follow up with patient in 14-21 business days if documents have not been returned.  Maud Deed Chana Bode [place] Certified Pharmacy Technician [place] Management Direct Dial:[phone]

## 2018-08-31 ENCOUNTER — Ambulatory Visit: Payer: Self-pay | Admitting: Pharmacist

## 2018-09-06 ENCOUNTER — Other Ambulatory Visit: Payer: Self-pay | Admitting: Pharmacist

## 2018-09-06 ENCOUNTER — Other Ambulatory Visit: Payer: Self-pay | Admitting: Family Medicine

## 2018-09-06 NOTE — Patient Outreach (Signed)
[facility]) Care Management [place]  09/06/2018  Janet Aguirre 1945/09/18 [id number]  Reason for referral: medication management/assistance  Successful outreach to [name] with HIPAA identifiers verified.  Patient states she is doing well today and continue to abide by the [place] stay at home recommendations.  She states her breathing is okay.  She reports a non-productive cough for which she is using cough drops for relief & to sooth.  She denies fever/SOB/wheezing.  She is aware of the S/SX of COVID and is instructed to call PCP if her SX persist or worsen.   She has not filled out the PAP application that was mailed to her by [place] for Spiriva & Albuterol.  Reminded patient that her PCP would like for her to start daily maintenance therapy with a LAMA (Spiriva) based on current DX & SX.  Encouraged patient to return these papers in order for her to get assistance.  She states she has a rescue inhaler on hand.     PLAN: -I will follow up as needed with PAP process/new start [place] (Spiriva)   Regina Eck, PharmD, [place]  [phone]

## 2018-09-20 ENCOUNTER — Other Ambulatory Visit: Payer: Self-pay | Admitting: Family Medicine

## 2018-09-25 ENCOUNTER — Other Ambulatory Visit: Payer: Self-pay | Admitting: Family Medicine

## 2018-09-26 ENCOUNTER — Other Ambulatory Visit: Payer: Self-pay | Admitting: Pharmacist

## 2018-09-26 NOTE — Patient Outreach (Signed)
[facility]) Care Management [place]  09/26/2018  Janet Aguirre Feb 20, 1946 [id number]  Reason for referral: medication assistance  [facility] pharmacy case is being closed due to the following reasons:  HIPAA compliant message left on patient's VM.  The patient has not returned patient assistance applications despite multiple calls & reminders.  Original applications mailed to patient on 07/06/18.  Will route notification of case closure to [facility] CPhT, Etter Sjogren.  Patient has been provided [facility] CM contact information if assistance needed in the future.    Thank you for allowing [facility] pharmacy to be involved in this patient's care.    Regina Eck, PharmD, [place]  [phone]

## 2018-11-04 IMAGING — MG DIGITAL SCREENING BILATERAL MAMMOGRAM WITH TOMO AND CAD
8 series · 9 of 24 positions shown · non-contrast
Comparison: Previous exam(s).

CLINICAL DATA: Screening.

EXAM:
DIGITAL SCREENING BILATERAL MAMMOGRAM WITH TOMO AND CAD

[L CC synth-2D]
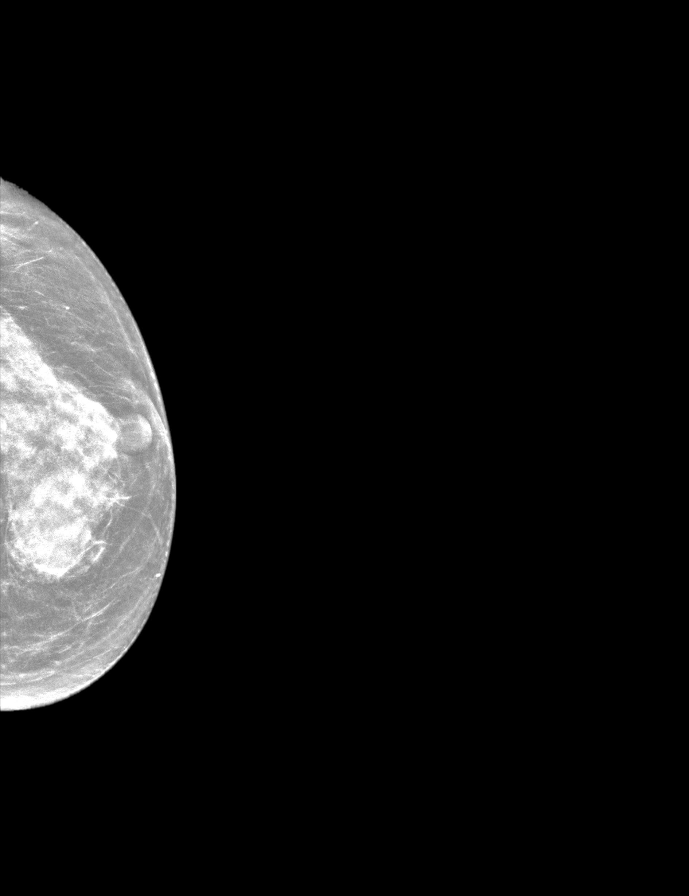

[R CC synth-2D]
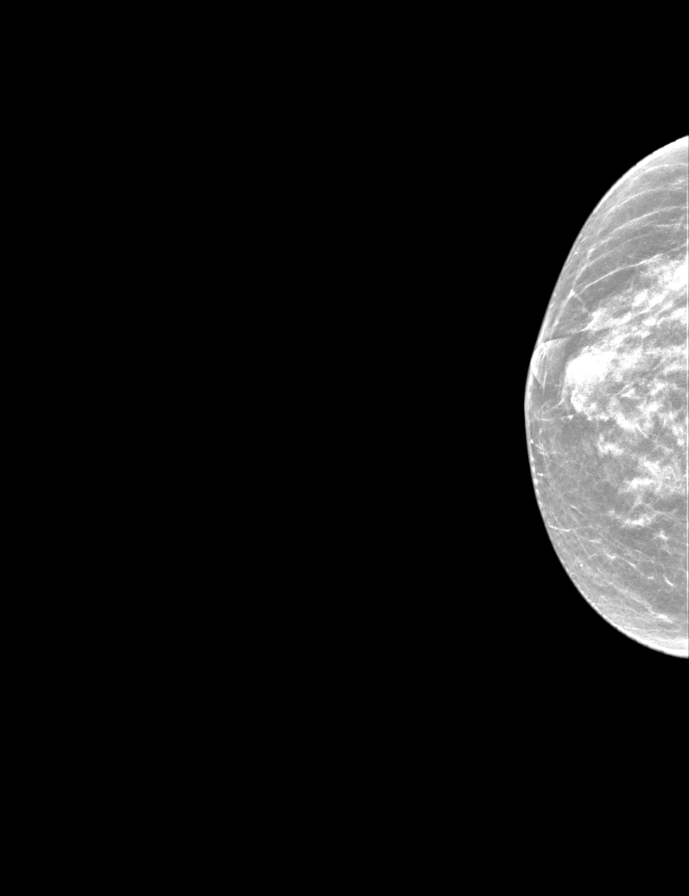

[R MLO synth-2D]
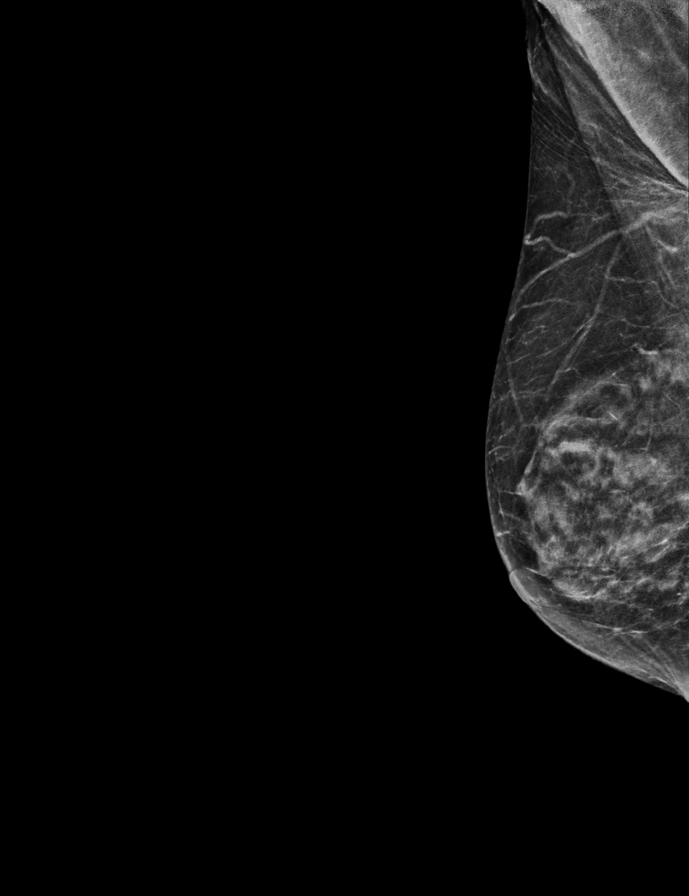

[L MLO synth-2D]
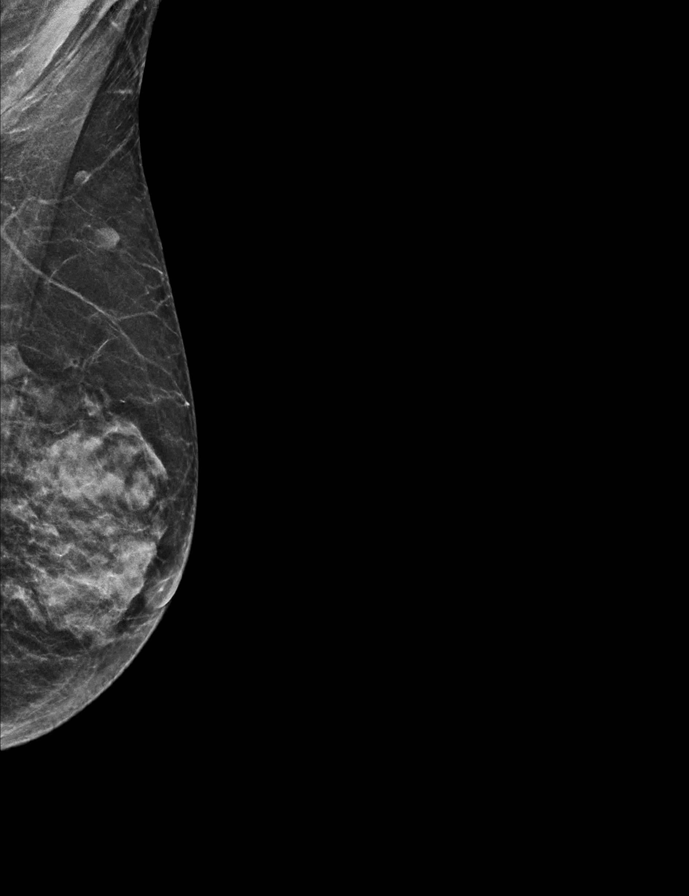

[R CC tomo · 2 of 44 frames shown]
[frame 15/44]
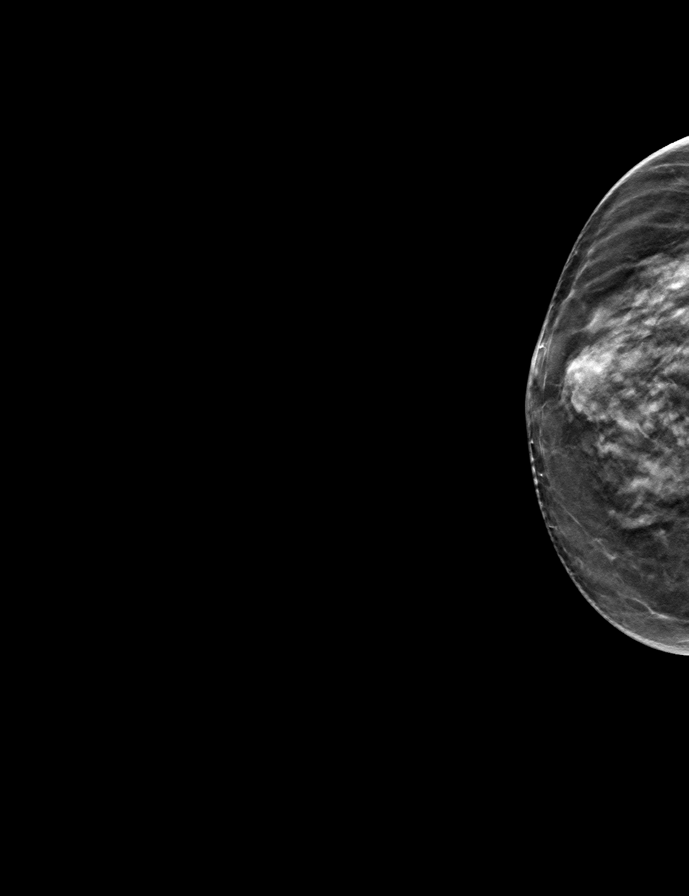
[frame 23/44]
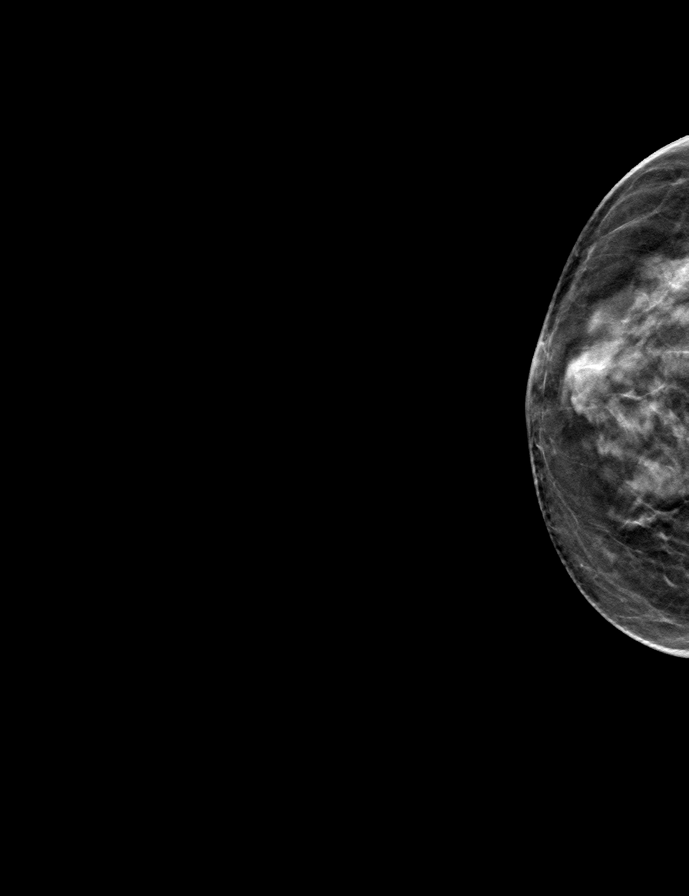

[R MLO tomo · tomo slice 25/49.0]
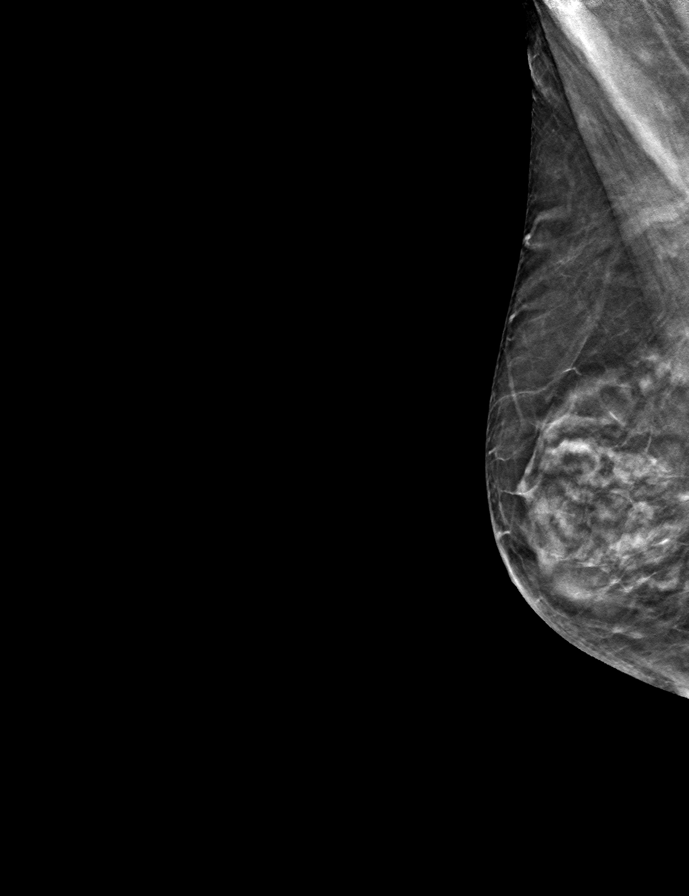

[L CC tomo · tomo slice 27/52.0]
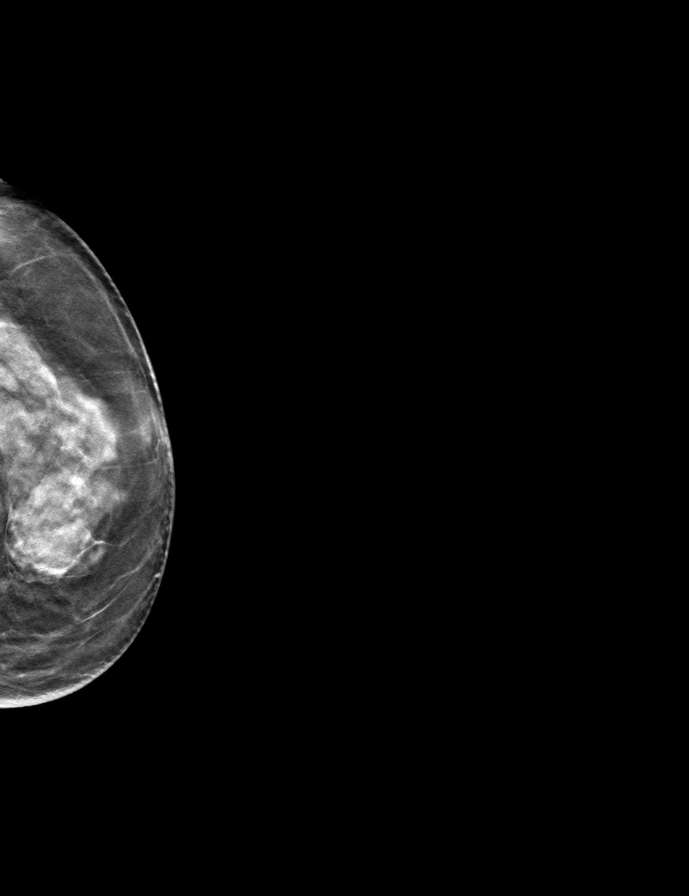

[L MLO tomo · tomo slice 23/44.0]
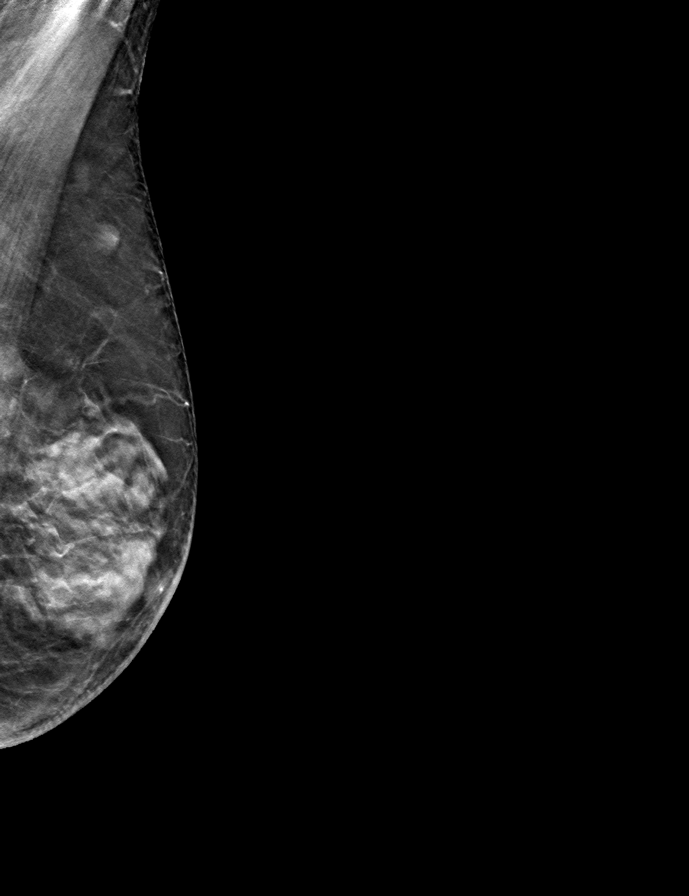

[9 of 24 positions shown; findings below may reference images not displayed]

ACR Breast Density Category c: The breast tissue is heterogeneously
dense, which may obscure small masses.
FINDINGS: In the left breast, a possible mass warrants further evaluation. In
the right breast, no findings suspicious for malignancy.

Images were processed with CAD.
IMPRESSION: Further evaluation is suggested for possible mass in the left
breast.

RECOMMENDATION:
Diagnostic mammogram and possibly ultrasound of the left breast.
(Code:[id number])

The patient will be contacted regarding the findings, and additional
imaging will be scheduled.

BI-RADS CATEGORY  0: Incomplete. Need additional imaging evaluation
and/or prior mammograms for comparison.

## 2018-12-31 ENCOUNTER — Other Ambulatory Visit: Payer: Self-pay

## 2018-12-31 ENCOUNTER — Encounter: Payer: Self-pay | Admitting: Family Medicine

## 2018-12-31 ENCOUNTER — Ambulatory Visit (INDEPENDENT_AMBULATORY_CARE_PROVIDER_SITE_OTHER): Payer: Medicare Other | Admitting: Family Medicine

## 2018-12-31 VITALS — BP 132/58 | HR 68 | Temp 98.2°F | Ht 61.0 in | Wt 97.0 lb

## 2018-12-31 DIAGNOSIS — F321 Major depressive disorder, single episode, moderate: Secondary | ICD-10-CM

## 2018-12-31 DIAGNOSIS — I10 Essential (primary) hypertension: Secondary | ICD-10-CM

## 2018-12-31 DIAGNOSIS — J449 Chronic obstructive pulmonary disease, unspecified: Secondary | ICD-10-CM | POA: Diagnosis not present

## 2018-12-31 DIAGNOSIS — Z720 Tobacco use: Secondary | ICD-10-CM

## 2018-12-31 MED ORDER — LISINOPRIL 20 MG PO TABS: 40.0000 mg | ORAL_TABLET | Freq: Every day | ORAL | 0 refills | Status: DC

## 2018-12-31 MED ORDER — DULOXETINE HCL 60 MG PO CPEP: 60.0000 mg | ORAL_CAPSULE | Freq: Every day | ORAL | 1 refills | Status: DC

## 2018-12-31 NOTE — Assessment & Plan Note (Signed)
Regimen.  Refilled for 6 months.
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
## 2018-12-31 NOTE — Patient Instructions (Signed)
Can try Voltaren gel for your arthritis it is now over-the-counter.  Check your albuterol inhaler and see if it is expired if it is were more than happy to send a new one into your pharmacy.

## 2018-12-31 NOTE — Assessment & Plan Note (Signed)
She has been working on cutting back but is not quite ready to quit.  Just gave her encouragement today.

## 2018-12-31 NOTE — Assessment & Plan Note (Signed)
Well controlled. Continue current regimen. Follow up in  6 months.  

## 2018-12-31 NOTE — Assessment & Plan Note (Signed)
Feels like she is stable no recent flares.  Did encourage her to check her albuterol inhaler at home and just make sure that it is not expired and give [place] a call back.

## 2019-01-01 LAB — BASIC METABOLIC PANEL WITH GFR
BUN: 17 mg/dL (ref 7–25)
CO2: 28 mmol/L (ref 20–32)
Calcium: 10.2 mg/dL (ref 8.6–10.4)
Chloride: 105 mmol/L (ref 98–110)
Creat: 0.91 mg/dL (ref 0.60–0.93)
GFR, Est African American: 72 mL/min/{1.73_m2} (ref >=60)
GFR, Est Non African American: 62 mL/min/{1.73_m2} (ref >=60)
Glucose, Bld: 90 mg/dL (ref 65–99)
Potassium: 4.3 mmol/L (ref 3.5–5.3)
Sodium: 144 mmol/L (ref 135–146)

## 2019-01-01 NOTE — Progress Notes (Signed)
All labs are normal. 

## 2019-01-17 ENCOUNTER — Other Ambulatory Visit: Payer: Self-pay | Admitting: *Deleted

## 2019-01-17 MED ORDER — ALENDRONATE SODIUM 70 MG PO TABS: ORAL_TABLET | ORAL | 11 refills | Status: DC

## 2019-01-17 MED ORDER — AMLODIPINE BESYLATE 5 MG PO TABS: 5.0000 mg | ORAL_TABLET | Freq: Every day | ORAL | 0 refills | Status: DC

## 2019-05-29 ENCOUNTER — Other Ambulatory Visit: Payer: Self-pay | Admitting: Family Medicine

## 2019-06-28 ENCOUNTER — Other Ambulatory Visit: Payer: Self-pay | Admitting: Family Medicine

## 2019-07-01 ENCOUNTER — Ambulatory Visit (INDEPENDENT_AMBULATORY_CARE_PROVIDER_SITE_OTHER): Payer: Medicare Other | Admitting: Family Medicine

## 2019-07-01 ENCOUNTER — Other Ambulatory Visit: Payer: Self-pay

## 2019-07-01 ENCOUNTER — Encounter: Payer: Self-pay | Admitting: Family Medicine

## 2019-07-01 VITALS — BP 118/63 | HR 69 | Ht 61.0 in | Wt 93.0 lb

## 2019-07-01 DIAGNOSIS — I10 Essential (primary) hypertension: Secondary | ICD-10-CM

## 2019-07-01 DIAGNOSIS — H538 Other visual disturbances: Secondary | ICD-10-CM

## 2019-07-01 DIAGNOSIS — F321 Major depressive disorder, single episode, moderate: Secondary | ICD-10-CM

## 2019-07-01 DIAGNOSIS — R7309 Other abnormal glucose: Secondary | ICD-10-CM | POA: Diagnosis not present

## 2019-07-01 DIAGNOSIS — Z720 Tobacco use: Secondary | ICD-10-CM

## 2019-07-01 DIAGNOSIS — R208 Other disturbances of skin sensation: Secondary | ICD-10-CM | POA: Diagnosis not present

## 2019-07-01 DIAGNOSIS — R2689 Other abnormalities of gait and mobility: Secondary | ICD-10-CM | POA: Insufficient documentation

## 2019-07-01 DIAGNOSIS — J449 Chronic obstructive pulmonary disease, unspecified: Secondary | ICD-10-CM

## 2019-07-01 DIAGNOSIS — D649 Anemia, unspecified: Secondary | ICD-10-CM | POA: Diagnosis not present

## 2019-07-01 DIAGNOSIS — R29898 Other symptoms and signs involving the musculoskeletal system: Secondary | ICD-10-CM | POA: Diagnosis not present

## 2019-07-01 MED ORDER — BUPROPION HCL ER (XL) 150 MG PO TB24: 150.0000 mg | ORAL_TABLET | ORAL | 1 refills | Status: DC

## 2019-07-01 NOTE — Assessment & Plan Note (Signed)
Clear etiology at this point it seems to be coming and going.  So unsure if it could be positional vertigo or blood pressure related.  The her blood pressure actually looks really fantastic today.

## 2019-07-01 NOTE — Assessment & Plan Note (Signed)
STable. NO recent flares or exacerbations.

## 2019-07-01 NOTE — Assessment & Plan Note (Signed)
Discussed options.  At this point I am not sure that her Cymbalta has been very effective for her but she does have a lot of external pressures and stressors right now.  Did discuss the possibility of working with a therapist or counselor she will think about it.  We also discussed possibly changing her Cymbalta but she says she just filled a 90-day supply and so really does not want to change it so we also discussed add-on therapy with Wellbutrin.  We will give that a try to see if it is helpful.
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
## 2019-07-01 NOTE — Assessment & Plan Note (Signed)
Well controlled. Continue current regimen. Follow up in  6 months.  

## 2019-07-02 LAB — HEMOGLOBIN A1C
Hgb A1c MFr Bld: 5.8 % of total Hgb — ABNORMAL HIGH (ref <5.7)
Mean Plasma Glucose: 120 (calc)
eAG (mmol/L): 6.6 (calc)

## 2019-07-02 LAB — CBC
HCT: 46 % — ABNORMAL HIGH (ref 35.0–45.0)
Hemoglobin: 15.3 g/dL (ref 11.7–15.5)
MCH: 29.8 pg (ref 27.0–33.0)
MCHC: 33.3 g/dL (ref 32.0–36.0)
MCV: 89.7 fL (ref 80.0–100.0)
MPV: 10.1 fL (ref 7.5–12.5)
Platelets: 426 10*3/uL — ABNORMAL HIGH (ref 140–400)
RBC: 5.13 10*6/uL — ABNORMAL HIGH (ref 3.80–5.10)
RDW: 13.2 % (ref 11.0–15.0)
WBC: 10.7 10*3/uL (ref 3.8–10.8)

## 2019-07-02 LAB — COMPLETE METABOLIC PANEL WITH GFR
AG Ratio: 1.7 (calc) (ref 1.0–2.5)
ALT: 7 U/L (ref 6–29)
AST: 15 U/L (ref 10–35)
Albumin: 4.2 g/dL (ref 3.6–5.1)
Alkaline phosphatase (APISO): 54 U/L (ref 37–153)
BUN/Creatinine Ratio: 26 (calc) — ABNORMAL HIGH (ref 6–22)
BUN: 24 mg/dL (ref 7–25)
CO2: 27 mmol/L (ref 20–32)
Calcium: 10.3 mg/dL (ref 8.6–10.4)
Chloride: 105 mmol/L (ref 98–110)
Creat: 0.94 mg/dL — ABNORMAL HIGH (ref 0.60–0.93)
GFR, Est African American: 69 mL/min/{1.73_m2} (ref >=60)
GFR, Est Non African American: 59 mL/min/{1.73_m2} — ABNORMAL LOW (ref >=60)
Globulin: 2.5 g/dL (calc) (ref 1.9–3.7)
Glucose, Bld: 103 mg/dL — ABNORMAL HIGH (ref 65–99)
Potassium: 4.2 mmol/L (ref 3.5–5.3)
Sodium: 140 mmol/L (ref 135–146)
Total Bilirubin: 0.5 mg/dL (ref 0.2–1.2)
Total Protein: 6.7 g/dL (ref 6.1–8.1)

## 2019-07-02 LAB — TSH: TSH: 4.99 mIU/L — ABNORMAL HIGH (ref 0.40–4.50)

## 2019-07-02 LAB — MAGNESIUM: Magnesium: 1.9 mg/dL (ref 1.5–2.5)

## 2019-07-02 LAB — FERRITIN: Ferritin: 66 ng/mL (ref 16–288)

## 2019-07-02 LAB — VITAMIN B12: Vitamin B-12: 377 pg/mL (ref 200–1100)

## 2019-07-03 ENCOUNTER — Other Ambulatory Visit: Payer: Self-pay

## 2019-07-03 MED ORDER — LORAZEPAM 0.5 MG PO TABS: ORAL_TABLET | ORAL | 0 refills | Status: DC

## 2019-07-03 NOTE — Telephone Encounter (Signed)
Meds sent for Ativan

## 2019-07-03 NOTE — Telephone Encounter (Signed)
Patient has MRI scheduled and needs pre-med for claustrophobia.   I pended an RX for Diazepam, please feel free to change if not appropriate

## 2019-07-15 ENCOUNTER — Other Ambulatory Visit: Payer: Medicare Other

## 2019-07-15 ENCOUNTER — Other Ambulatory Visit: Payer: Self-pay | Admitting: Family Medicine

## 2019-07-22 ENCOUNTER — Ambulatory Visit: Payer: Medicare Other | Admitting: Family Medicine

## 2019-07-29 ENCOUNTER — Other Ambulatory Visit: Payer: Medicare Other

## 2019-08-01 ENCOUNTER — Encounter: Payer: Self-pay | Admitting: Family Medicine

## 2019-08-01 ENCOUNTER — Ambulatory Visit (INDEPENDENT_AMBULATORY_CARE_PROVIDER_SITE_OTHER): Payer: Medicare Other | Admitting: Family Medicine

## 2019-08-01 ENCOUNTER — Other Ambulatory Visit: Payer: Self-pay

## 2019-08-01 VITALS — BP 110/54 | HR 77 | Ht 61.0 in | Wt 93.0 lb

## 2019-08-01 DIAGNOSIS — R350 Frequency of micturition: Secondary | ICD-10-CM | POA: Diagnosis not present

## 2019-08-01 DIAGNOSIS — F321 Major depressive disorder, single episode, moderate: Secondary | ICD-10-CM | POA: Diagnosis not present

## 2019-08-01 DIAGNOSIS — R2689 Other abnormalities of gait and mobility: Secondary | ICD-10-CM | POA: Diagnosis not present

## 2019-08-01 DIAGNOSIS — R29898 Other symptoms and signs involving the musculoskeletal system: Secondary | ICD-10-CM

## 2019-08-01 DIAGNOSIS — I10 Essential (primary) hypertension: Secondary | ICD-10-CM

## 2019-08-01 NOTE — Patient Instructions (Signed)
Blood pressures running a little bit low today so I want you to cut your lisinopril in half. Please take a half a tab daily of the lisinopril.  Come back in in about 3 to 4 weeks for nurse visit to let us recheck your blood pressure make sure it still well controlled on less medication.  If that works well then we can always change the dose once you are due for a new prescription.
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
## 2019-08-01 NOTE — Assessment & Plan Note (Signed)
Not currently on medication.  Patient encouraged to let us know if she feels like her symptoms are worsening.

## 2019-08-01 NOTE — Assessment & Plan Note (Signed)
Call the pharmacy to verify that they have the Wellbutrin prescription available for her to pick up.  Would like to see her back in about 4 to 6 weeks after she starts the medication.

## 2019-08-01 NOTE — Assessment & Plan Note (Signed)
Did encourage her to call to reschedule her MRI.  Explained that it is only approved for certain amount of time.  If she still wants to have this done.

## 2019-08-01 NOTE — Assessment & Plan Note (Signed)
Blood pressures running a little bit low today so I want you to cut your lisinopril in half. Please take a half a tab daily of the lisinopril.  Come back in in about 3 to 4 weeks for nurse visit to let us recheck your blood pressure make sure it still well controlled on less medication.  If that works well then we can always change the dose once you are due for a new prescription.

## 2019-08-06 ENCOUNTER — Telehealth: Payer: Self-pay

## 2019-08-06 NOTE — Telephone Encounter (Signed)
Janet Aguirre called and states after the medication change she is feeling much better. She wanted to say thank you.

## 2019-08-07 NOTE — Telephone Encounter (Signed)
Awesome! Thank you for letting us know.

## 2019-08-19 ENCOUNTER — Ambulatory Visit (INDEPENDENT_AMBULATORY_CARE_PROVIDER_SITE_OTHER): Payer: Medicare Other

## 2019-08-19 ENCOUNTER — Other Ambulatory Visit: Payer: Self-pay

## 2019-08-19 DIAGNOSIS — R29898 Other symptoms and signs involving the musculoskeletal system: Secondary | ICD-10-CM | POA: Diagnosis not present

## 2019-08-19 DIAGNOSIS — R208 Other disturbances of skin sensation: Secondary | ICD-10-CM | POA: Diagnosis not present

## 2019-08-19 DIAGNOSIS — D320 Benign neoplasm of cerebral meninges: Secondary | ICD-10-CM | POA: Diagnosis not present

## 2019-08-19 DIAGNOSIS — R2689 Other abnormalities of gait and mobility: Secondary | ICD-10-CM | POA: Diagnosis not present

## 2019-08-19 DIAGNOSIS — H538 Other visual disturbances: Secondary | ICD-10-CM

## 2019-08-19 MED ORDER — GADOBUTROL 1 MMOL/ML IV SOLN
4.0000 mL | Freq: Once | INTRAVENOUS | Status: AC | PRN
  Administered 2019-08-19: 4 mL via INTRAVENOUS

## 2019-08-20 ENCOUNTER — Telehealth: Payer: Self-pay | Admitting: Family Medicine

## 2019-08-20 DIAGNOSIS — G9389 Other specified disorders of brain: Secondary | ICD-10-CM

## 2019-08-20 NOTE — Telephone Encounter (Signed)
Spoke with Janet Aguirre about getting her scheduled with oncology and neurosurgery for the brain mass.  They should call to schedule her an appointment.  She still having the blurry vision and the weakness and gait instability.  I did discuss with her that she really should not be driving encouraged her to ask her sister to take her.  She says she also has a friend that might be able to drive her as well.

## 2019-08-22 ENCOUNTER — Ambulatory Visit: Payer: Medicare Other

## 2019-08-23 ENCOUNTER — Ambulatory Visit (INDEPENDENT_AMBULATORY_CARE_PROVIDER_SITE_OTHER): Payer: Medicare Other | Admitting: Family Medicine

## 2019-08-23 ENCOUNTER — Other Ambulatory Visit: Payer: Self-pay

## 2019-08-23 VITALS — BP 140/61 | HR 69 | Ht 61.0 in | Wt 92.0 lb

## 2019-08-23 DIAGNOSIS — I10 Essential (primary) hypertension: Secondary | ICD-10-CM | POA: Diagnosis not present

## 2019-08-23 MED ORDER — AMLODIPINE BESYLATE 5 MG PO TABS: 5.0000 mg | ORAL_TABLET | Freq: Every day | ORAL | 0 refills | Status: DC

## 2019-08-23 NOTE — Progress Notes (Signed)
Agree with documentation as above.   Santonio Speakman, MD  

## 2019-08-23 NOTE — Progress Notes (Signed)
Pt here for BP check. She is taking 20 mg of the Lisinopril daily.  She denies any CP/SOB/or headaches.  Her first bp reading was elevated. l had her to sit for 10 min and rechecked.   Pt told to f/u w/pcp in 3 months for bp/mood or sooner if needed. She voiced understanding and agreed.

## 2019-08-26 ENCOUNTER — Telehealth: Payer: Self-pay

## 2019-08-26 NOTE — Telephone Encounter (Signed)
OK for new refer to neurosur in [facility]

## 2019-08-26 NOTE — Telephone Encounter (Signed)
Janet Aguirre states she went to [facility] for an appointment with Ashok Pall, MD. Thursday she stayed for hours before she was informed he was not going to be there due to complications of surgery. She was rescheduled for today. This morning she waited an hour and left. She wants to be referred to a different office.

## 2019-08-27 NOTE — Telephone Encounter (Signed)
Printed original referral and gave to [facility]. She will change to a different neuro office.

## 2019-09-24 ENCOUNTER — Other Ambulatory Visit: Payer: Self-pay | Admitting: Family Medicine

## 2019-11-18 ENCOUNTER — Other Ambulatory Visit: Payer: Self-pay | Admitting: Family Medicine

## 2019-11-18 DIAGNOSIS — I10 Essential (primary) hypertension: Secondary | ICD-10-CM

## 2019-11-25 ENCOUNTER — Ambulatory Visit: Payer: Medicare Other | Admitting: Family Medicine

## 2019-11-25 DIAGNOSIS — R7301 Impaired fasting glucose: Secondary | ICD-10-CM | POA: Insufficient documentation
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
## 2019-12-12 ENCOUNTER — Encounter: Payer: Self-pay | Admitting: Family Medicine

## 2019-12-12 ENCOUNTER — Ambulatory Visit (INDEPENDENT_AMBULATORY_CARE_PROVIDER_SITE_OTHER): Payer: Medicare Other | Admitting: Family Medicine

## 2019-12-12 VITALS — BP 133/59 | HR 68 | Ht 61.0 in | Wt 93.0 lb

## 2019-12-12 DIAGNOSIS — F321 Major depressive disorder, single episode, moderate: Secondary | ICD-10-CM

## 2019-12-12 DIAGNOSIS — G9389 Other specified disorders of brain: Secondary | ICD-10-CM | POA: Diagnosis not present

## 2019-12-12 DIAGNOSIS — H1031 Unspecified acute conjunctivitis, right eye: Secondary | ICD-10-CM

## 2019-12-12 DIAGNOSIS — I10 Essential (primary) hypertension: Secondary | ICD-10-CM | POA: Diagnosis not present
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
## 2019-12-13 ENCOUNTER — Encounter: Payer: Self-pay | Admitting: Family Medicine

## 2019-12-13 MED ORDER — AZITHROMYCIN 1 % OP SOLN: 1.0000 [drp] | Freq: Two times a day (BID) | OPHTHALMIC | 0 refills | Status: AC

## 2019-12-13 MED ORDER — LISINOPRIL 20 MG PO TABS: 40.0000 mg | ORAL_TABLET | Freq: Every day | ORAL | 0 refills | Status: DC

## 2019-12-13 NOTE — Assessment & Plan Note (Signed)
Very concerned that it has now been almost 2-1/2 months since we found the brain mass and she has not actually seen a neurosurgeon.  She became very frustrated after 2 attempts to see the initial 1 where there were delays.  We actually called brain and spine back today to see if we could go ahead and get her appointment expedited we are still waiting to hear back from them.  Evidently the initial referral to [facility] was actually placed to neurology incorrectly.  So they will have to review the case before they can schedule her.

## 2019-12-13 NOTE — Assessment & Plan Note (Signed)
Her blood pressure actually looks pretty good today.  So we can continue with current regimen with lisinopril and amlodipine.  Due for refill on lisinopril.

## 2019-12-13 NOTE — Assessment & Plan Note (Addendum)
Unfortunately dealing with multiple deaths in the family still grieving but feeling like she is coping okay.  Continue current regimen with Cymbalta.  She feels like it is working well and is happy with her regimen.

## 2020-01-21 ENCOUNTER — Other Ambulatory Visit: Payer: Self-pay | Admitting: Family Medicine

## 2020-02-11 DIAGNOSIS — D329 Benign neoplasm of meninges, unspecified: Secondary | ICD-10-CM | POA: Diagnosis not present

## 2020-04-17 ENCOUNTER — Other Ambulatory Visit: Payer: Self-pay | Admitting: Family Medicine

## 2020-04-17 DIAGNOSIS — I10 Essential (primary) hypertension: Secondary | ICD-10-CM

## 2020-10-02 ENCOUNTER — Telehealth: Payer: Self-pay | Admitting: *Deleted

## 2020-10-02 ENCOUNTER — Other Ambulatory Visit: Payer: Self-pay | Admitting: Family Medicine

## 2020-10-02 DIAGNOSIS — I10 Essential (primary) hypertension: Secondary | ICD-10-CM

## 2020-10-02 NOTE — Telephone Encounter (Signed)
Janet Aguirre with [facility] stated that pt called and informed him that she has only been taking 20 mg of the lisinopril because she is unable to cut these in half. He asked if she could get a 30 mg tablet instead so that she didn't have to worry about cutting the tablet and asked that this be sent into her local pharmacy.  Will have front office to contact pt for f/u since she hasn't been seen since 12/12/2019 and will need to be seen.

## 2020-10-02 NOTE — Telephone Encounter (Signed)
Please call pt and have her schedule an appt for f/u on bp. She also is due for labs. Her last OV was 12/12/2019. Thanks.

## 2020-10-02 NOTE — Telephone Encounter (Signed)
Please have patient schedule follow-up appointment.  She has not been seen since last August.  And if she is only taking one of the 20 mg tabs that might actually be adequate as her blood pressure looks great the last time she was here.  So would not recommend going higher until I see her and see her blood pressure.

## 2020-10-02 NOTE — Telephone Encounter (Signed)
Left voicemail for patient to call back to get this appointment scheduled. AM

## 2020-10-05 NOTE — Telephone Encounter (Signed)
I called pt and left a VM for her to call our office and schedule a F/U appt with Dr.Metheney to F/u on her Meds and BP

## 2020-10-21 DIAGNOSIS — H43813 Vitreous degeneration, bilateral: Secondary | ICD-10-CM | POA: Diagnosis not present

## 2020-10-21 DIAGNOSIS — H04123 Dry eye syndrome of bilateral lacrimal glands: Secondary | ICD-10-CM | POA: Diagnosis not present

## 2020-10-21 DIAGNOSIS — H5231 Anisometropia: Secondary | ICD-10-CM | POA: Diagnosis not present

## 2020-10-21 DIAGNOSIS — Z135 Encounter for screening for eye and ear disorders: Secondary | ICD-10-CM | POA: Diagnosis not present

## 2020-10-21 DIAGNOSIS — H25813 Combined forms of age-related cataract, bilateral: Secondary | ICD-10-CM | POA: Diagnosis not present

## 2020-10-26 ENCOUNTER — Ambulatory Visit: Payer: Medicare Other | Admitting: Family Medicine

## 2020-11-05 ENCOUNTER — Other Ambulatory Visit: Payer: Self-pay

## 2020-11-05 ENCOUNTER — Encounter: Payer: Self-pay | Admitting: Family Medicine

## 2020-11-05 ENCOUNTER — Ambulatory Visit (INDEPENDENT_AMBULATORY_CARE_PROVIDER_SITE_OTHER): Payer: Medicare Other | Admitting: Family Medicine

## 2020-11-05 VITALS — BP 129/71 | HR 93 | Ht 61.0 in | Wt 90.0 lb

## 2020-11-05 DIAGNOSIS — R7301 Impaired fasting glucose: Secondary | ICD-10-CM | POA: Diagnosis not present

## 2020-11-05 DIAGNOSIS — J449 Chronic obstructive pulmonary disease, unspecified: Secondary | ICD-10-CM | POA: Diagnosis not present

## 2020-11-05 DIAGNOSIS — M1991 Primary osteoarthritis, unspecified site: Secondary | ICD-10-CM

## 2020-11-05 DIAGNOSIS — F339 Major depressive disorder, recurrent, unspecified: Secondary | ICD-10-CM

## 2020-11-05 DIAGNOSIS — I10 Essential (primary) hypertension: Secondary | ICD-10-CM | POA: Diagnosis not present

## 2020-11-05 DIAGNOSIS — R634 Abnormal weight loss: Secondary | ICD-10-CM | POA: Diagnosis not present

## 2020-11-05 LAB — LIPID PANEL W/REFLEX DIRECT LDL
Cholesterol: 272 mg/dL — ABNORMAL HIGH (ref <200)
HDL: 53 mg/dL (ref >=50)
LDL Cholesterol (Calc): 185 mg/dL (calc) — ABNORMAL HIGH
Non-HDL Cholesterol (Calc): 219 mg/dL (calc) — ABNORMAL HIGH (ref <130)
Total CHOL/HDL Ratio: 5.1 (calc) — ABNORMAL HIGH (ref <5.0)
Triglycerides: 184 mg/dL — ABNORMAL HIGH (ref <150)

## 2020-11-05 LAB — POCT GLYCOSYLATED HEMOGLOBIN (HGB A1C): Hemoglobin A1C: 5.5 % (ref 4.0–5.6)

## 2020-11-05 LAB — COMPLETE METABOLIC PANEL WITH GFR
AG Ratio: 1.6 (calc) (ref 1.0–2.5)
ALT: 7 U/L (ref 6–29)
AST: 15 U/L (ref 10–35)
Albumin: 4.7 g/dL (ref 3.6–5.1)
Alkaline phosphatase (APISO): 64 U/L (ref 37–153)
BUN: 16 mg/dL (ref 7–25)
CO2: 32 mmol/L (ref 20–32)
Calcium: 11.3 mg/dL — ABNORMAL HIGH (ref 8.6–10.4)
Chloride: 102 mmol/L (ref 98–110)
Creat: 0.92 mg/dL (ref 0.60–1.00)
Globulin: 2.9 g/dL (calc) (ref 1.9–3.7)
Glucose, Bld: 93 mg/dL (ref 65–99)
Potassium: 4.6 mmol/L (ref 3.5–5.3)
Sodium: 142 mmol/L (ref 135–146)
Total Bilirubin: 0.6 mg/dL (ref 0.2–1.2)
Total Protein: 7.6 g/dL (ref 6.1–8.1)
eGFR: 65 mL/min/{1.73_m2} (ref >=60)

## 2020-11-05 LAB — CBC
HCT: 46.1 % — ABNORMAL HIGH (ref 35.0–45.0)
Hemoglobin: 15.4 g/dL (ref 11.7–15.5)
MCH: 29.6 pg (ref 27.0–33.0)
MCHC: 33.4 g/dL (ref 32.0–36.0)
MCV: 88.7 fL (ref 80.0–100.0)
MPV: 9.8 fL (ref 7.5–12.5)
Platelets: 562 10*3/uL — ABNORMAL HIGH (ref 140–400)
RBC: 5.2 10*6/uL — ABNORMAL HIGH (ref 3.80–5.10)
RDW: 13.5 % (ref 11.0–15.0)
WBC: 10.3 10*3/uL (ref 3.8–10.8)

## 2020-11-05 MED ORDER — LISINOPRIL 20 MG PO TABS: 20.0000 mg | ORAL_TABLET | Freq: Every day | ORAL | 1 refills | Status: DC

## 2020-11-05 MED ORDER — AMLODIPINE BESYLATE 5 MG PO TABS: 5.0000 mg | ORAL_TABLET | Freq: Every day | ORAL | 1 refills | Status: DC

## 2020-11-05 MED ORDER — ALBUTEROL SULFATE HFA 108 (90 BASE) MCG/ACT IN AERS: 1.0000 | INHALATION_SPRAY | Freq: Four times a day (QID) | RESPIRATORY_TRACT | 2 refills | Status: AC | PRN
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
## 2020-11-05 NOTE — Assessment & Plan Note (Signed)
I do think she would really benefit from therapy/counseling and maybe even  some grief counseling she said she will seriously consider it and let me know.  She is also under a lot of financial stress right now and that is been a little challenging.  She is just about to pay her home off but she just has a lot of incoming bills.  She wants to come off of the Cymbalta.  I am not sure that it is really helping with her mood or her pain so I think it is absolutely reasonable to taper the medication off.

## 2020-11-05 NOTE — Assessment & Plan Note (Signed)
A1c looks phenomenal today.  Though likely down because of her recent weight loss we did discuss trying to do things like the boost.  I was hoping that we had some coupons here in the office but we could not find any.  Lab Results  Component Value Date   HGBA1C 5.5 11/05/2020

## 2020-11-05 NOTE — Assessment & Plan Note (Signed)
We will go ahead and taper off the Cymbalta.  Call if decides that maybe she was getting benefit and will restart but otherwise continue to use Eksir strength Tylenol which seems to be very effective for her.

## 2020-11-05 NOTE — Assessment & Plan Note (Signed)
Well controlled. Continue current regimen. Follow up in  6 mo  

## 2020-11-05 NOTE — Assessment & Plan Note (Signed)
Stable on current regimen.  She is really really due for repeat spirometry we would love to get that scheduled this fall if at all possible.  Do want a make sure that she at least has an albuterol on file.

## 2020-12-03 ENCOUNTER — Telehealth: Payer: Self-pay | Admitting: *Deleted

## 2020-12-03 NOTE — Telephone Encounter (Signed)
Pt called stating that Dr. Madilyn Fireman had recommended that see someone. I told her that from the notes Dr. Madilyn Fireman felt that she would benefit from therapy/counseling or even grief counseling. Pt stated that she has to much on her plate right now and she will let us know whenever she is ready to do this.   She states that she has a few more Cymbalta left and will finish those and continue taking the ES Tylenol like she has been since this has been helping with her back pain.    She is trying to eat more. She continues to drink the Boost supplements. I told her that if I come across any of the coupons that I would mail those to her.

## 2020-12-09 ENCOUNTER — Telehealth: Payer: Self-pay | Admitting: Lab

## 2020-12-09 NOTE — Chronic Care Management (AMB) (Signed)
  Chronic Care Management   Note  12/09/2020 Name: KAROLINE NOSEK MRN: [id number] DOB: 05/22/1945  KENDRIX SMITHEE is a 75 y.o. year old female who is a primary care patient of Madilyn Fireman, Rene Kocher, MD. I reached out to Philomena Doheny by phone today in response to a referral sent by Ms. Wandra Arthurs Trent's PCP, Hali Marry, MD.   Ms. Sarao was given information about Chronic Care Management services today including:  CCM service includes personalized support from designated clinical staff supervised by her physician, including individualized plan of care and coordination with other care providers 24/7 contact phone numbers for assistance for urgent and routine care needs. Service will only be billed when office clinical staff spend 20 minutes or more in a month to coordinate care. Only one practitioner may furnish and bill the service in a calendar month. The patient may stop CCM services at any time (effective at the end of the month) by phone call to the office staff.   Patient agreed to services and verbal consent obtained.   Follow up plan:   [place]

## 2021-01-15 ENCOUNTER — Ambulatory Visit (INDEPENDENT_AMBULATORY_CARE_PROVIDER_SITE_OTHER): Payer: Medicare Other | Admitting: Family Medicine

## 2021-01-15 DIAGNOSIS — Z0000 Encounter for general adult medical examination without abnormal findings: Secondary | ICD-10-CM

## 2021-01-15 DIAGNOSIS — Z780 Asymptomatic menopausal state: Secondary | ICD-10-CM

## 2021-01-15 DIAGNOSIS — F17200 Nicotine dependence, unspecified, uncomplicated: Secondary | ICD-10-CM

## 2021-01-15 DIAGNOSIS — Z122 Encounter for screening for malignant neoplasm of respiratory organs: Secondary | ICD-10-CM | POA: Diagnosis not present
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
## 2021-01-15 NOTE — Patient Instructions (Addendum)
[place] Maintenance Summary and Written Plan of Care  Ms. Janet Aguirre ,  Thank you for allowing me to perform your Medicare Annual Wellness Visit and for your ongoing commitment to your health.   Health Maintenance & Immunization History Health Maintenance  Topic Date Due   COVID-19 Vaccine (1) 01/31/2021 (Originally 08/04/1946)   Zoster Vaccines- Shingrix (1 of 2) 04/16/2021 (Originally 02/03/1994)   INFLUENZA VACCINE  07/16/2021 (Originally 11/16/2020)   TETANUS/TDAP  02/16/2021   DEXA SCAN  Completed   Hepatitis C Screening  Completed   HPV VACCINES  Aged Out   Immunization History  Administered Date(s) Administered   Pneumococcal Conjugate-13 01/30/2017   Pneumococcal Polysaccharide-23 02/02/2010    These are the patient goals that we discussed:  Goals Addressed               This Visit's Progress     Patient Stated (pt-stated)        01/15/2021 AWV Goal: Exercise for General Health  Patient will verbalize understanding of the benefits of increased physical activity: Exercising regularly is important. It will improve your overall fitness, flexibility, and endurance. Regular exercise also will improve your overall health. It can help you control your weight, reduce stress, and improve your bone density. Over the next year, patient will increase physical activity as tolerated with a goal of at least 150 minutes of moderate physical activity per week.  You can tell that you are exercising at a moderate intensity if your heart starts beating faster and you start breathing faster but can still hold a conversation. Moderate-intensity exercise ideas include: Walking 1 mile (1.6 km) in about 15 minutes Biking Hiking Golfing Dancing Water aerobics Patient will verbalize understanding of everyday activities that increase physical activity by providing examples like the following: Yard work, such as: Geophysical data processor Gardening Washing windows or floors Patient will be able to explain general safety guidelines for exercising:  Before you start a new exercise program, talk with your health care provider. Do not exercise so much that you hurt yourself, feel dizzy, or get very short of breath. Wear comfortable clothes and wear shoes with good support. Drink plenty of water while you exercise to prevent dehydration or heat stroke. Work out until your breathing and your heartbeat get faster.          This is a list of Health Maintenance Items that are overdue or due now: Influenza vaccine Td vaccine Bone densitometry screening Shingrix  Orders/Referrals Placed Today: Orders Placed This Encounter  Procedures   DEXAScan    Standing Status:   Future    Standing Expiration Date:   01/15/2022    Order Specific Question:   Reason for exam:    Answer:   Post menopausal    Order Specific Question:   Preferred imaging location?    Answer:   [facility]   Ambulatory Referral Lung Cancer Screening [facility]    Referral Priority:   Routine    Referral Type:   Consultation    Referral Reason:   Specialty Services Required    Number of Visits Requested:   1   (Contact our referral department at [phone] if you have not spoken with someone about your referral appointment within the next 5 days)    Follow-up Plan Follow-up with Janet Marry, MD as planned Bone density scan and lung screening referrals have been sent  and they will call you to schedule. Schedule your tetanus shot and shingles shot at your pharmacy. Medicare wellness visit in one one year. AVS printed and mailed to the patient.

## 2021-01-18 ENCOUNTER — Telehealth: Payer: Self-pay | Admitting: Pharmacist

## 2021-01-18 NOTE — Chronic Care Management (AMB) (Signed)
    Chronic Care Management Pharmacy Assistant   Name: Janet Aguirre  MRN: [id number] DOB: 1946-04-13  Janet Aguirre is an 75 y.o. year old female who presents for her initial CCM visit with the clinical pharmacist.  Recent office visits:  01/15/21 Janet Lecher MD PCP- pt was seen for post menopause. Dexascan was done and a referral for Lung cancer screening at [facility] was placed. Follow up with PCP as planned.  11/05/20 Janet Lecher MD- pt was seen for HTN. Labs were ordered and pt was started on Albuterol Sulfate 108 q6h prn. Patients Lisinopril was decreased to 20 mg and Duloxetine HCI was discontinued. Follow up in 6 months.  Recent consult visits:  None Noted  Hospital visits:  None in previous 6 months  Medications: Outpatient Encounter Medications as of 01/18/2021  Medication Sig   albuterol (PROAIR HFA) 108 (90 Base) MCG/ACT inhaler Inhale 1-2 puffs into the lungs every 6 (six) hours as needed for wheezing or shortness of breath.   alendronate (FOSAMAX) 70 MG tablet TAKE 1 TABLET BY MOUTH EVERY 7 DAYS WITH A FULL GLASS OF WATER ON AN EMPTY STOMACH   amLODipine (NORVASC) 5 MG tablet Take 1 tablet (5 mg total) by mouth daily.   azithromycin (AZASITE) 1 % ophthalmic solution Place 1 drop into the right eye 2 (two) times daily. X 5 days (Patient not taking: Reported on 01/15/2021)   CALCIUM-VITAMIN D PO Take 1 tablet by mouth 2 (two) times daily.   lisinopril (ZESTRIL) 20 MG tablet Take 1 tablet (20 mg total) by mouth daily.   loratadine (CLARITIN) 10 MG tablet Take 10 mg by mouth daily as needed for allergies.   Omega-3 Fatty Acids (FISH OIL PO) Take by mouth.   No facility-administered encounter medications on file as of 01/18/2021.    Current Medication List  albuterol [facility] HFA) 108 (90 Base) MCG/ACT last filled 11/05/20  alendronate (FOSAMAX) 70 MG tablet last filled 01/22/20 12 DS amLODipine (NORVASC) 5 MG tablet last filled 11/05/20 90 DS azithromycin  (AZASITE) 1 % ophthalmic solution last filled 12/13/19  CALCIUM-VITAMIN D PO lisinopril (ZESTRIL) 20 MG tablet last filled 11/05/20 90 DS loratadine (CLARITIN) 10 MG tablet  Omega-3 Fatty Acids ([place])  [place] Pharmacist Assistant [phone]

## 2021-01-20 DIAGNOSIS — H52203 Unspecified astigmatism, bilateral: Secondary | ICD-10-CM | POA: Diagnosis not present

## 2021-01-20 DIAGNOSIS — H5052 Exophoria: Secondary | ICD-10-CM | POA: Diagnosis not present

## 2021-01-20 DIAGNOSIS — H25813 Combined forms of age-related cataract, bilateral: Secondary | ICD-10-CM | POA: Diagnosis not present

## 2021-01-20 DIAGNOSIS — H40003 Preglaucoma, unspecified, bilateral: Secondary | ICD-10-CM | POA: Diagnosis not present

## 2021-01-20 DIAGNOSIS — H527 Unspecified disorder of refraction: Secondary | ICD-10-CM | POA: Diagnosis not present

## 2021-01-20 DIAGNOSIS — H04123 Dry eye syndrome of bilateral lacrimal glands: Secondary | ICD-10-CM | POA: Diagnosis not present

## 2021-01-20 DIAGNOSIS — H43811 Vitreous degeneration, right eye: Secondary | ICD-10-CM | POA: Diagnosis not present

## 2021-01-21 ENCOUNTER — Ambulatory Visit (INDEPENDENT_AMBULATORY_CARE_PROVIDER_SITE_OTHER): Payer: Medicare Other | Admitting: Pharmacist

## 2021-01-21 ENCOUNTER — Other Ambulatory Visit: Payer: Self-pay

## 2021-01-21 DIAGNOSIS — E785 Hyperlipidemia, unspecified: Secondary | ICD-10-CM

## 2021-01-21 DIAGNOSIS — Z720 Tobacco use: Secondary | ICD-10-CM

## 2021-01-21 DIAGNOSIS — I10 Essential (primary) hypertension: Secondary | ICD-10-CM

## 2021-01-21 NOTE — Progress Notes (Signed)
Current Barriers:  None at present  Pharmacist Clinical Goal(s):  Over the next 30 days, patient will adhere to plan to optimize therapeutic regimen for chronic conditions as evidenced by report of adherence to recommended medication management changes through collaboration with PharmD and provider.    Chronic Care Management Pharmacy Note  01/21/2021 Name:  Janet Aguirre MRN:  [id number] DOB:  01/08/1946  Summary: addressed HTN, HLD, tobacco use   Recommendations/Changes made from today's visit: For hyperlipidemia, counseled on lifestyle modifications as well as medication options apart from statins. Collaborated with PCP to consider ezetemibe, as patient is unwilling to do injections (PSCK9i). Also will have ongoing discussion about possibly quitting smoking as patient seemed slightly interested.  Plan: f/u with pharmacist in 1 month.  Subjective: Janet Aguirre is an 75 y.o. year old female who is a primary patient of Aguirre, Janet Kocher, MD.  The CCM team was consulted for assistance with disease management and care coordination needs.    Engaged with patient by telephone for initial visit in response to provider referral for pharmacy case management and/or care coordination services.   Consent to Services:  The patient was given information about Chronic Care Management services, agreed to services, and gave verbal consent prior to initiation of services.  Please see initial visit note for detailed documentation.   Patient Care Team: Hali Marry, MD as PCP - General (Family Medicine) Darius Bump, [facility] as Pharmacist (Pharmacist)  Recent office visits:  01/15/21 Janet Lecher MD PCP- pt was seen for post menopause. Dexascan was done and a referral for Lung cancer screening at [facility] was placed. Follow up with PCP as planned.   11/05/20 Janet Lecher MD- pt was seen for HTN. Labs were ordered and pt was started on Albuterol Sulfate 108 q6h prn.  Patients Lisinopril was decreased to 20 mg and Duloxetine HCI was discontinued. Follow up in 6 months.   Recent consult visits:  None Noted   Hospital visits:  None in previous 6 months  Objective:  Lab Results  Component Value Date   CREATININE 0.92 11/05/2020   CREATININE 0.94 (H) 07/01/2019   CREATININE 0.91 12/31/2018    Lab Results  Component Value Date   HGBA1C 5.5 11/05/2020   Last diabetic Eye exam: No results found for: HMDIABEYEEXA  Last diabetic Foot exam: No results found for: HMDIABFOOTEX      Component Value Date/Time   CHOL 272 (H) 11/05/2020 0000   TRIG 184 (H) 11/05/2020 0000   HDL 53 11/05/2020 0000   CHOLHDL 5.1 (H) 11/05/2020 0000   VLDL 18 01/06/2016 0951   LDLCALC 185 (H) 11/05/2020 0000   LDLDIRECT 194.5 05/22/2013 0911    Hepatic Function Latest Ref Rng & Units 11/05/2020 07/01/2019 06/28/2018  Total Protein 6.1 - 8.1 g/dL 7.6 6.7 7.5  Albumin 3.6 - 5.1 g/dL - - -  AST 10 - 35 U/L 15 15 16   ALT 6 - 29 U/L 7 7 8   Alk Phosphatase 33 - 130 U/L - - -  Total Bilirubin 0.2 - 1.2 mg/dL 0.6 0.5 0.5    Lab Results  Component Value Date/Time   TSH 4.99 (H) 07/01/2019 11:47 AM   TSH 4.71 (H) 09/22/2016 11:07 AM   FREET4 0.90 09/18/2008 09:34 AM    CBC Latest Ref Rng & Units 11/05/2020 07/01/2019 05/22/2013  WBC 3.8 - 10.8 Thousand/uL 10.3 10.7 14.9(H)  Hemoglobin 11.7 - 15.5 g/dL 15.4 15.3 15.4(H)  Hematocrit 35.0 - 45.0 % 46.1(H)  46.0(H) 47.8(H)  Platelets 140 - 400 Thousand/uL 562(H) 426(H) 433.0(H)    Lab Results  Component Value Date/Time   VD25OH 34 01/06/2016 09:51 AM   VD25OH 54 05/22/2013 09:11 AM    Clinical ASCVD: Yes  The 10-year ASCVD risk score (Arnett DK, et al., 2019) is: 32.5%   Values used to calculate the score:     Age: 21 years     Sex: Female     Is Non-Hispanic African American: No     Diabetic: No     Tobacco smoker: Yes     Systolic Blood Pressure: 053 mmHg     Is BP treated: Yes     HDL Cholesterol: 53 mg/dL      Total Cholesterol: 272 mg/dL    Other: (CHADS2VASc if Afib, PHQ9 if depression, MMRC or CAT for COPD, ACT, DEXA)  Social History   Tobacco Use  Smoking Status Every Day   Packs/day: 1.00   Years: 47.00   Pack years: 47.00   Types: Cigarettes  Smokeless Tobacco Never   BP Readings from Last 3 Encounters:  11/05/20 129/71  12/12/19 (!) 133/59  08/23/19 140/61   Pulse Readings from Last 3 Encounters:  11/05/20 93  12/12/19 68  08/23/19 69   Wt Readings from Last 3 Encounters:  11/05/20 90 lb (40.8 kg)  12/12/19 93 lb (42.2 kg)  08/23/19 92 lb (41.7 kg)    Assessment: Review of patient past medical history, allergies, medications, health status, including review of consultants reports, laboratory and other test data, was performed as part of comprehensive evaluation and provision of chronic care management services.   SDOH:  (Social Determinants of Health) assessments and interventions performed:    CCM Care Plan  Allergies  Allergen Reactions   Hydrochlorothiazide Other (See Comments)    Inc BUN/CR   Lovastatin Other (See Comments)    Myalgias   Pravastatin Other (See Comments)    Myalgias     Medications Reviewed Today     Reviewed by Darius Bump, [facility] (Pharmacist) on 01/21/21 at 1121  Med List Status: <None>   Medication Order Taking? Sig Documenting Provider Last Dose Status Informant  albuterol (PROAIR HFA) 108 (90 Base) MCG/ACT inhaler [id number] No Inhale 1-2 puffs into the lungs every 6 (six) hours as needed for wheezing or shortness of breath.  Patient not taking: Reported on 01/21/2021   Hali Marry, MD Not Taking Active   alendronate (FOSAMAX) 70 MG tablet [id number] Yes TAKE 1 TABLET BY MOUTH EVERY 7 DAYS WITH A FULL GLASS OF WATER ON AN EMPTY STOMACH Hali Marry, MD Taking Active            Med Note Tonny Bollman Jan 21, 2021 11:15 AM) Valentino Saxon to take Sat or Sun  amLODipine (NORVASC) 5 MG tablet [id number] Yes Take 1 tablet  (5 mg total) by mouth daily. Hali Marry, MD Taking Active   azithromycin (AZASITE) 1 % ophthalmic solution [id number] No Place 1 drop into the right eye 2 (two) times daily. X 5 days  Patient not taking: No sig reported   Hali Marry, MD Not Taking Active   CALCIUM-VITAMIN D PO [id number] Yes Take 1 tablet by mouth 2 (two) times daily. [provider] Taking Active   lisinopril (ZESTRIL) 20 MG tablet [id number] Yes Take 1 tablet (20 mg total) by mouth daily. Hali Marry, MD Taking Active   loratadine (CLARITIN) 10 MG tablet [id number] Yes Take 10  mg by mouth daily as needed for allergies. [provider] Taking Active   Omega-3 Fatty Acids (FISH OIL PO) [id number] Yes Take 500 mg by mouth daily. [provider] Taking Active             Patient Active Problem List   Diagnosis Date Noted   Depression, recurrent ([place]) 11/05/2020   Brain mass 12/12/2019   IFG (impaired fasting glucose) 11/25/2019   Balance disorder 07/01/2019   Fracture of radius, left, closed 10/02/2017   COPD, group B, by GOLD 2017 classification ([place]) 01/22/2016   Hypercalcemia 01/07/2016   Osteoarthritis 05/22/2013   Tobacco abuse 11/15/2011   Osteoporosis 11/15/2011   MENOPAUSAL DISORDER 06/23/2009   SHOULDER PAIN, LEFT 06/05/2009   KNEE PAIN, RIGHT 06/05/2009   Depression, major, single episode, moderate ([place]) 09/17/2008   Hyperlipidemia 09/25/2007   HYPERTENSION, BENIGN 09/24/2007   Urinary frequency 09/24/2007    Immunization History  Administered Date(s) Administered   Pneumococcal Conjugate-13 01/30/2017   Pneumococcal Polysaccharide-23 02/02/2010    Conditions to be addressed/monitored: HTN, HLD, and tobacco use  Care Plan : Medication Management  Updates made by Darius Bump, [facility] since 01/21/2021 12:00 AM     Problem: HTN, HLD, tobacco use      Long-Range Goal: Disease Progression Prevention   Start Date: 01/21/2021  This Visit's  Progress: On track  Priority: High  Note:   Interventions: 1:1 collaboration with Hali Marry, MD regarding development and update of comprehensive plan of care as evidenced by provider attestation and co-signature Inter-disciplinary care team collaboration (see longitudinal plan of care) Comprehensive medication review performed; medication list updated in electronic medical record  Hypertension:  Controlled; current treatment:amlodipine 64m daily, lisinopril 228mdaily;   Current home readings: not currently checking  Denies hypotensive/hypertensive symptoms  Recommended continue current regimen,   Hyperlipidemia:  Uncontrolled; current treatment:lifestyle modifications only;   Medications previously tried: lovastatin, pravastatin   Current dietary patterns: limiting greasy foods, oils  Counseled on lifestyle modifications as well as medication options apart from statins. Collaborated with PCP to consider ezetemibe, as patient is unwilling to do injections (PSCK9i).  Tobacco Abuse:  1.5 packs per day; 60 years of use; does smoke within 30 minutes of waking up  Previous quit attempts: tried tapering off but hasn't tried to completely quit  Triggers to smoke: "nothing, just habit" or nerves  Motivation to quit smoking: none at present  On a scale of 1-10, how IMPORTANT is it for you to quit smoking: TBD  On a scale of 1-10, how CONFIDENT are you that you can quit smoking: TBD  Counseled on various options for support with quitting such as NRT or medications, patient was considering the possibility of attempting to quit and will discuss during our next visit  Patient Goals/Self-Care Activities Over the next 30 days, patient will:  take medications as prescribed and collaborate with provider on medication access solutions  Follow Up Plan: Telephone follow up appointment with care management team member scheduled for:  1 month      Medication Assistance: None required.   Patient affirms current coverage meets needs.  Patient's preferred pharmacy is:  [id number], [place] - [id number]: [phone]: [phone] pill box? Yes Pt endorses 100% compliance  Follow Up:  Patient agrees to Care Plan and Follow-up.  Plan: Telephone follow up appointment with care management team member scheduled  for:  1 month  Darius Bump

## 2021-01-21 NOTE — Patient Instructions (Signed)
Visit Information   PATIENT GOALS:   Goals Addressed             This Visit's Progress    Medication Management       Patient Goals/Self-Care Activities Over the next 30 days, patient will:  take medications as prescribed and collaborate with provider on medication access solutions  Follow Up Plan: Telephone follow up appointment with care management team member scheduled for:  1 month         Consent to CCM Services: Ms. Oconnor was given information about Chronic Care Management services including:  CCM service includes personalized support from designated clinical staff supervised by her physician, including individualized plan of care and coordination with other care providers 24/7 contact phone numbers for assistance for urgent and routine care needs. Service will only be billed when office clinical staff spend 20 minutes or more in a month to coordinate care. Only one practitioner may furnish and bill the service in a calendar month. The patient may stop CCM services at any time (effective at the end of the month) by phone call to the office staff. The patient will be responsible for cost sharing (co-pay) of up to 20% of the service fee (after annual deductible is met).  Patient agreed to services and verbal consent obtained.   The patient verbalized understanding of instructions, educational materials, and care plan provided today and agreed to receive a mailed copy of patient instructions, educational materials, and care plan.   Telephone follow up appointment with care management team member scheduled for: 1 month  Larinda Buttery, PharmD Clinical Pharmacist [facility] [phone]   CLINICAL CARE PLAN: Patient Care Plan: Medication Management     Problem Identified: HTN, HLD, tobacco use      Long-Range Goal: Disease Progression Prevention   Start Date: 01/21/2021  This Visit's Progress: On track  Priority: High  Note:    Interventions: 1:1 collaboration with Hali Marry, MD regarding development and update of comprehensive plan of care as evidenced by provider attestation and co-signature Inter-disciplinary care team collaboration (see longitudinal plan of care) Comprehensive medication review performed; medication list updated in electronic medical record  Hypertension:  Controlled; current treatment:amlodipine 88m daily, lisinopril 280mdaily;   Current home readings: not currently checking  Denies hypotensive/hypertensive symptoms  Recommended continue current regimen,   Hyperlipidemia:  Uncontrolled; current treatment:lifestyle modifications only;   Medications previously tried: lovastatin, pravastatin   Current dietary patterns: limiting greasy foods, oils  Counseled on lifestyle modifications as well as medication options apart from statins. Collaborated with PCP to consider ezetemibe and/or bempedoic acid, as patient is unwilling to do injections (PSCK9i).  Tobacco Abuse:  1.5 packs per day; 60 years of use; does smoke within 30 minutes of waking up  Previous quit attempts: tried tapering off but hasn't tried to completely quit  Triggers to smoke: "nothing, just habit" or nerves  Motivation to quit smoking: none at present  On a scale of 1-10, how IMPORTANT is it for you to quit smoking: TBD  On a scale of 1-10, how CONFIDENT are you that you can quit smoking: TBD  Counseled on various options for support with quitting such as NRT or medications, patient was considering the possibility of attempting to quit and will discuss during our next visit  Patient Goals/Self-Care Activities Over the next 30 days, patient will:  take medications as prescribed and collaborate with provider on medication access solutions  Follow Up Plan: Telephone  follow up appointment with care management team member scheduled for:  1 month

## 2021-01-28 DIAGNOSIS — F17200 Nicotine dependence, unspecified, uncomplicated: Secondary | ICD-10-CM | POA: Diagnosis not present

## 2021-01-28 DIAGNOSIS — J449 Chronic obstructive pulmonary disease, unspecified: Secondary | ICD-10-CM | POA: Diagnosis not present

## 2021-01-28 DIAGNOSIS — H25812 Combined forms of age-related cataract, left eye: Secondary | ICD-10-CM | POA: Diagnosis not present

## 2021-01-28 DIAGNOSIS — Z888 Allergy status to other drugs, medicaments and biological substances status: Secondary | ICD-10-CM | POA: Diagnosis not present

## 2021-01-28 DIAGNOSIS — H25813 Combined forms of age-related cataract, bilateral: Secondary | ICD-10-CM | POA: Diagnosis not present

## 2021-01-28 DIAGNOSIS — I10 Essential (primary) hypertension: Secondary | ICD-10-CM | POA: Diagnosis not present

## 2021-01-28 DIAGNOSIS — F17210 Nicotine dependence, cigarettes, uncomplicated: Secondary | ICD-10-CM | POA: Diagnosis not present

## 2021-01-28 DIAGNOSIS — H04123 Dry eye syndrome of bilateral lacrimal glands: Secondary | ICD-10-CM | POA: Diagnosis not present

## 2021-01-28 DIAGNOSIS — Z79899 Other long term (current) drug therapy: Secondary | ICD-10-CM | POA: Diagnosis not present

## 2021-01-28 DIAGNOSIS — H43813 Vitreous degeneration, bilateral: Secondary | ICD-10-CM | POA: Diagnosis not present

## 2021-02-11 DIAGNOSIS — F17210 Nicotine dependence, cigarettes, uncomplicated: Secondary | ICD-10-CM | POA: Diagnosis not present

## 2021-02-11 DIAGNOSIS — H2511 Age-related nuclear cataract, right eye: Secondary | ICD-10-CM | POA: Diagnosis not present

## 2021-02-11 DIAGNOSIS — H04123 Dry eye syndrome of bilateral lacrimal glands: Secondary | ICD-10-CM | POA: Diagnosis not present

## 2021-02-11 DIAGNOSIS — Z7983 Long term (current) use of bisphosphonates: Secondary | ICD-10-CM | POA: Diagnosis not present

## 2021-02-11 DIAGNOSIS — M810 Age-related osteoporosis without current pathological fracture: Secondary | ICD-10-CM | POA: Diagnosis not present

## 2021-02-11 DIAGNOSIS — H25813 Combined forms of age-related cataract, bilateral: Secondary | ICD-10-CM | POA: Diagnosis not present

## 2021-02-11 DIAGNOSIS — I10 Essential (primary) hypertension: Secondary | ICD-10-CM | POA: Diagnosis not present

## 2021-02-11 DIAGNOSIS — H52221 Regular astigmatism, right eye: Secondary | ICD-10-CM | POA: Diagnosis not present

## 2021-02-11 DIAGNOSIS — Z79899 Other long term (current) drug therapy: Secondary | ICD-10-CM | POA: Diagnosis not present

## 2021-02-11 DIAGNOSIS — H43813 Vitreous degeneration, bilateral: Secondary | ICD-10-CM | POA: Diagnosis not present

## 2021-02-11 DIAGNOSIS — J449 Chronic obstructive pulmonary disease, unspecified: Secondary | ICD-10-CM | POA: Diagnosis not present

## 2021-02-11 DIAGNOSIS — Z888 Allergy status to other drugs, medicaments and biological substances status: Secondary | ICD-10-CM | POA: Diagnosis not present

## 2021-02-11 DIAGNOSIS — H25811 Combined forms of age-related cataract, right eye: Secondary | ICD-10-CM | POA: Diagnosis not present

## 2021-02-15 DIAGNOSIS — I10 Essential (primary) hypertension: Secondary | ICD-10-CM | POA: Diagnosis not present

## 2021-02-15 DIAGNOSIS — E785 Hyperlipidemia, unspecified: Secondary | ICD-10-CM

## 2021-02-17 ENCOUNTER — Other Ambulatory Visit: Payer: Medicare Other

## 2021-02-17 ENCOUNTER — Ambulatory Visit: Payer: Medicare Other | Admitting: Family Medicine

## 2021-02-25 ENCOUNTER — Ambulatory Visit (INDEPENDENT_AMBULATORY_CARE_PROVIDER_SITE_OTHER): Payer: Medicare Other | Admitting: Pharmacist

## 2021-02-25 ENCOUNTER — Other Ambulatory Visit: Payer: Self-pay

## 2021-02-25 DIAGNOSIS — Z720 Tobacco use: Secondary | ICD-10-CM

## 2021-02-25 DIAGNOSIS — I10 Essential (primary) hypertension: Secondary | ICD-10-CM

## 2021-02-25 DIAGNOSIS — E785 Hyperlipidemia, unspecified: Secondary | ICD-10-CM

## 2021-02-25 NOTE — Patient Instructions (Signed)
Visit Information  Patient Goals/Self-Care Activities Over the next 180 days, patient will:  take medications as prescribed and collaborate with provider on medication access solutions  Follow Up Plan: Telephone follow up appointment with care management team member scheduled for:  6 months  The patient verbalized understanding of instructions, educational materials, and care plan provided today and agreed to receive a mailed copy of patient instructions, educational materials, and care plan.   Telephone follow up appointment with care management team member scheduled for: 6-8 months  Darius Bump

## 2021-02-25 NOTE — Progress Notes (Signed)
Chronic Care Management Pharmacy Note  02/25/2021 Name:  Janet Aguirre MRN:  [id number] DOB:  1945/07/24  Summary: addressed HTN, HLD, tobacco use. Patient is unsure about taking actionable steps to reduce or quit smoking.   Recommendations/Changes made from today's visit:  No changes at this time, let patient know that after January PCP visit if wanting to take more steps to reduce/quit smoking, can meet onsite with pharmacist sooner for in depth review of nicotine replacement or medication options.  Previous recommendation to consider ezetemibe in future, as patient is unwilling to do injections [facility]), will continue to follow.  Plan: f/u with pharmacist in 6-8 months  Subjective: Janet Aguirre is an 75 y.o. year old female who is a primary patient of Metheney, Rene Kocher, MD.  The CCM team was consulted for assistance with disease management and care coordination needs.    Engaged with patient by telephone for initial visit in response to provider referral for pharmacy case management and/or care coordination services.   Consent to Services:  The patient was given information about Chronic Care Management services, agreed to services, and gave verbal consent prior to initiation of services.  Please see initial visit note for detailed documentation.   Patient Care Team: Hali Marry, MD as PCP - General (Family Medicine) Darius Bump, [facility] as Pharmacist (Pharmacist)  Recent office visits:  01/15/21 [name] MD PCP- pt was seen for post menopause. Dexascan was done and a referral for Lung cancer screening at [facility] was placed. Follow up with PCP as planned.   11/05/20 [name] MD- pt was seen for HTN. Labs were ordered and pt was started on Albuterol Sulfate 108 q6h prn. Patients Lisinopril was decreased to 20 mg and Duloxetine HCI was discontinued. Follow up in 6 months.   Recent consult visits:  None Noted   Hospital visits:  None in  previous 6 months  Objective:  Lab Results  Component Value Date   CREATININE 0.92 11/05/2020   CREATININE 0.94 (H) 07/01/2019   CREATININE 0.91 12/31/2018    Lab Results  Component Value Date   HGBA1C 5.5 11/05/2020       Component Value Date/Time   CHOL 272 (H) 11/05/2020 0000   TRIG 184 (H) 11/05/2020 0000   HDL 53 11/05/2020 0000   CHOLHDL 5.1 (H) 11/05/2020 0000   VLDL 18 01/06/2016 0951   LDLCALC 185 (H) 11/05/2020 0000   LDLDIRECT 194.5 05/22/2013 0911    Hepatic Function Latest Ref Rng & Units 11/05/2020 07/01/2019 06/28/2018  Total Protein 6.1 - 8.1 g/dL 7.6 6.7 7.5  Albumin 3.6 - 5.1 g/dL - - -  AST 10 - 35 U/L 15 15 16   ALT 6 - 29 U/L 7 7 8   Alk Phosphatase 33 - 130 U/L - - -  Total Bilirubin 0.2 - 1.2 mg/dL 0.6 0.5 0.5    Lab Results  Component Value Date/Time   TSH 4.99 (H) 07/01/2019 11:47 AM   TSH 4.71 (H) 09/22/2016 11:07 AM   FREET4 0.90 09/18/2008 09:34 AM    CBC Latest Ref Rng & Units 11/05/2020 07/01/2019 05/22/2013  WBC 3.8 - 10.8 Thousand/uL 10.3 10.7 14.9(H)  Hemoglobin 11.7 - 15.5 g/dL 15.4 15.3 15.4(H)  Hematocrit 35.0 - 45.0 % 46.1(H) 46.0(H) 47.8(H)  Platelets 140 - 400 Thousand/uL 562(H) 426(H) 433.0(H)    Lab Results  Component Value Date/Time   VD25OH 34 01/06/2016 09:51 AM   VD25OH 54 05/22/2013 09:11 AM    Clinical  ASCVD: Yes  The 10-year ASCVD risk score (Arnett DK, et al., 2019) is: 19.3%   Values used to calculate the score:     Age: 14 years     Sex: Female     Is Non-Hispanic African American: No     Diabetic: No     Tobacco smoker: Yes     Systolic Blood Pressure: 92 mmHg     Is BP treated: Yes     HDL Cholesterol: 53 mg/dL     Total Cholesterol: 272 mg/dL    Social History   Tobacco Use  Smoking Status Every Day   Packs/day: 1.00   Years: 47.00   Pack years: 47.00   Types: Cigarettes  Smokeless Tobacco Never   BP Readings from Last 3 Encounters:  11/05/20 129/71  12/12/19 (!) 133/59  08/23/19 140/61    Pulse Readings from Last 3 Encounters:  11/05/20 93  12/12/19 68  08/23/19 69   Wt Readings from Last 3 Encounters:  11/05/20 90 lb (40.8 kg)  12/12/19 93 lb (42.2 kg)  08/23/19 92 lb (41.7 kg)    Assessment: Review of patient past medical history, allergies, medications, health status, including review of consultants reports, laboratory and other test data, was performed as part of comprehensive evaluation and provision of chronic care management services.   SDOH:  (Social Determinants of Health) assessments and interventions performed:    CCM Care Plan  Allergies  Allergen Reactions   Hydrochlorothiazide Other (See Comments)    Inc BUN/CR   Lovastatin Other (See Comments)    Myalgias   Pravastatin Other (See Comments)    Myalgias     Medications Reviewed Today     Reviewed by Darius Bump, [facility] (Pharmacist) on 02/25/21 at [id number]  Med List Status: <None>   Medication Order Taking? Sig Documenting Provider Last Dose Status Informant  albuterol (PROAIR HFA) 108 (90 Base) MCG/ACT inhaler [id number] No Inhale 1-2 puffs into the lungs every 6 (six) hours as needed for wheezing or shortness of breath.  Patient not taking: No sig reported   Hali Marry, MD Not Taking Active   alendronate (FOSAMAX) 70 MG tablet [id number] Yes TAKE 1 TABLET BY MOUTH EVERY 7 DAYS WITH A FULL GLASS OF WATER ON AN EMPTY STOMACH Hali Marry, MD Taking Active            Med Note Tonny Bollman Jan 21, 2021 11:15 AM) Valentino Saxon to take Sat or Sun  amLODipine (NORVASC) 5 MG tablet [id number] Yes Take 1 tablet (5 mg total) by mouth daily. Hali Marry, MD Taking Active   azithromycin (AZASITE) 1 % ophthalmic solution [id number] No Place 1 drop into the right eye 2 (two) times daily. X 5 days  Patient not taking: No sig reported   Hali Marry, MD Not Taking Active   CALCIUM-VITAMIN D PO [id number] Yes Take 1 tablet by mouth 2 (two) times daily. [provider] Taking Active   lisinopril (ZESTRIL) 20 MG tablet [id number] Yes Take 1 tablet (20 mg total) by mouth daily. Hali Marry, MD Taking Active   loratadine (CLARITIN) 10 MG tablet [id number] Yes Take 10 mg by mouth daily as needed for allergies. [provider] Taking Active   Omega-3 Fatty Acids (FISH OIL PO) [id number] Yes Take 500 mg by mouth daily. [provider] Taking Active             Patient Active Problem List   Diagnosis  Date Noted   Depression, recurrent ([place]) 11/05/2020   Brain mass 12/12/2019   IFG (impaired fasting glucose) 11/25/2019   Balance disorder 07/01/2019   Fracture of radius, left, closed 10/02/2017   COPD, group B, by GOLD 2017 classification ([place]) 01/22/2016   Hypercalcemia 01/07/2016   Osteoarthritis 05/22/2013   Tobacco abuse 11/15/2011   Osteoporosis 11/15/2011   MENOPAUSAL DISORDER 06/23/2009   SHOULDER PAIN, LEFT 06/05/2009   KNEE PAIN, RIGHT 06/05/2009   Depression, major, single episode, moderate ([place]) 09/17/2008   Hyperlipidemia 09/25/2007   HYPERTENSION, BENIGN 09/24/2007   Urinary frequency 09/24/2007    Immunization History  Administered Date(s) Administered   Pneumococcal Conjugate-13 01/30/2017   Pneumococcal Polysaccharide-23 02/02/2010    Conditions to be addressed/monitored: HTN, HLD, and tobacco use  Care Plan : Medication Management  Updates made by Darius Bump, [place] since 02/25/2021 12:00 AM     Problem: HTN, HLD, tobacco use      Long-Range Goal: Disease Progression Prevention   Start Date: 01/21/2021  Recent Progress: On track  Priority: High  Note:   Interventions: 1:1 collaboration with Hali Marry, MD regarding development and update of comprehensive plan of care as evidenced by provider attestation and co-signature Inter-disciplinary care team collaboration (see longitudinal plan of care) Comprehensive medication review performed; medication list updated in electronic  medical record  Hypertension:  Controlled; current treatment:amlodipine 12m daily, lisinopril 235mdaily;   Current home readings: not currently checking  Denies hypotensive/hypertensive symptoms  Recommended continue current regimen,   Hyperlipidemia:  Uncontrolled; current treatment:lifestyle modifications only;   Medications previously tried: lovastatin, pravastatin   Current dietary patterns: limiting greasy foods, oils  Counseled on lifestyle modifications as well as medication options apart from statins. Previous recommendation to consider ezetemibe, as patient is unwilling to do injections [facility]  Tobacco Abuse :   1.5 packs per day; 60 years of use; does smoke within 30 minutes of waking up  Previous quit attempts: tried tapering off but hasn't tried to completely quit  Triggers to smoke: "nothing, just habit" or nerves  Motivation to quit smoking: none at present  On a scale of 1-10, how IMPORTANT is it for you to quit smoking: TBD  On a scale of 1-10, how CONFIDENT are you that you can quit smoking: TBD  Counseled on various options for support with quitting such as NRT or medications, patient was considering the possibility of attempting to quit and will discuss during our next visit  Patient Goals/Self-Care Activities Over the next 180 days, patient will:  take medications as prescribed and collaborate with provider on medication access solutions  Follow Up Plan: Telephone follow up appointment with care management team member scheduled for:  6 months      Medication Assistance: None required.  Patient affirms current coverage meets needs.  Patient's preferred pharmacy is:  [id number], [place] - [address]one: [phone]: [phone] pill box? Yes Pt endorses 100% compliance  Follow Up:  Patient agrees to Care Plan and  Follow-up.  Plan: Telephone follow up appointment with care management team member scheduled for:  6-8 months  KeeLarinda ButteryharmD Clinical Pharmacist [facility]University [phone]

## 2021-03-17 DIAGNOSIS — E785 Hyperlipidemia, unspecified: Secondary | ICD-10-CM

## 2021-03-17 DIAGNOSIS — I10 Essential (primary) hypertension: Secondary | ICD-10-CM | POA: Diagnosis not present

## 2021-03-25 ENCOUNTER — Other Ambulatory Visit: Payer: Self-pay

## 2021-03-25 DIAGNOSIS — I10 Essential (primary) hypertension: Secondary | ICD-10-CM

## 2021-03-25 MED ORDER — AMLODIPINE BESYLATE 5 MG PO TABS: 5.0000 mg | ORAL_TABLET | Freq: Every day | ORAL | 0 refills | Status: DC

## 2021-05-13 ENCOUNTER — Ambulatory Visit: Payer: Medicare Other | Admitting: Family Medicine

## 2021-08-04 DIAGNOSIS — R634 Abnormal weight loss: Secondary | ICD-10-CM | POA: Diagnosis not present

## 2021-08-04 DIAGNOSIS — F17210 Nicotine dependence, cigarettes, uncomplicated: Secondary | ICD-10-CM | POA: Diagnosis not present

## 2021-08-04 DIAGNOSIS — N6320 Unspecified lump in the left breast, unspecified quadrant: Secondary | ICD-10-CM | POA: Diagnosis not present

## 2021-08-04 DIAGNOSIS — M810 Age-related osteoporosis without current pathological fracture: Secondary | ICD-10-CM | POA: Diagnosis not present

## 2021-08-04 DIAGNOSIS — Z7689 Persons encountering health services in other specified circumstances: Secondary | ICD-10-CM | POA: Diagnosis not present

## 2021-08-04 DIAGNOSIS — J449 Chronic obstructive pulmonary disease, unspecified: Secondary | ICD-10-CM | POA: Diagnosis not present

## 2021-08-04 DIAGNOSIS — R928 Other abnormal and inconclusive findings on diagnostic imaging of breast: Secondary | ICD-10-CM | POA: Diagnosis not present

## 2021-08-04 DIAGNOSIS — R413 Other amnesia: Secondary | ICD-10-CM | POA: Diagnosis not present

## 2021-08-04 DIAGNOSIS — Z122 Encounter for screening for malignant neoplasm of respiratory organs: Secondary | ICD-10-CM | POA: Diagnosis not present

## 2021-08-16 DIAGNOSIS — R636 Underweight: Secondary | ICD-10-CM | POA: Diagnosis not present

## 2021-08-16 DIAGNOSIS — D508 Other iron deficiency anemias: Secondary | ICD-10-CM | POA: Diagnosis not present

## 2021-08-16 DIAGNOSIS — D473 Essential (hemorrhagic) thrombocythemia: Secondary | ICD-10-CM | POA: Diagnosis not present

## 2021-08-16 DIAGNOSIS — D75838 Other thrombocytosis: Secondary | ICD-10-CM | POA: Diagnosis not present

## 2021-10-04 ENCOUNTER — Telehealth: Payer: Self-pay | Admitting: *Deleted

## 2021-10-04 NOTE — Chronic Care Management (AMB) (Signed)
Pt left voicemail to cancel upcoming f/u with PharmD - returned call to pt to see if she wanted to reschedule at a later date - pt says she has switched pcp's and is now with [facility] and doesn't need CCM services at this time - cx'd appt as requested, un enrolled and FYI sent to clinician

## 2021-10-07 ENCOUNTER — Telehealth: Payer: Medicare Other

## 2021-11-26 DIAGNOSIS — D508 Other iron deficiency anemias: Secondary | ICD-10-CM | POA: Diagnosis not present

## 2021-11-26 DIAGNOSIS — R636 Underweight: Secondary | ICD-10-CM | POA: Diagnosis not present

## 2021-11-26 DIAGNOSIS — D75838 Other thrombocytosis: Secondary | ICD-10-CM | POA: Diagnosis not present

## 2021-12-22 ENCOUNTER — Other Ambulatory Visit: Payer: Self-pay | Admitting: Family Medicine

## 2021-12-22 DIAGNOSIS — I10 Essential (primary) hypertension: Secondary | ICD-10-CM

## 2021-12-23 ENCOUNTER — Other Ambulatory Visit: Payer: Self-pay | Admitting: Family Medicine

## 2021-12-23 DIAGNOSIS — I10 Essential (primary) hypertension: Secondary | ICD-10-CM

## 2022-01-07 ENCOUNTER — Other Ambulatory Visit: Payer: Self-pay | Admitting: Family Medicine

## 2022-01-07 DIAGNOSIS — I10 Essential (primary) hypertension: Secondary | ICD-10-CM

## 2022-05-25 ENCOUNTER — Ambulatory Visit: Payer: Medicaid Other | Admitting: Family Medicine

## 2022-05-25 DIAGNOSIS — D508 Other iron deficiency anemias: Secondary | ICD-10-CM | POA: Diagnosis not present

## 2022-05-25 DIAGNOSIS — R188 Other ascites: Secondary | ICD-10-CM | POA: Diagnosis not present

## 2022-05-25 DIAGNOSIS — R262 Difficulty in walking, not elsewhere classified: Secondary | ICD-10-CM | POA: Diagnosis not present

## 2022-05-25 DIAGNOSIS — J9601 Acute respiratory failure with hypoxia: Secondary | ICD-10-CM | POA: Diagnosis not present

## 2022-05-25 DIAGNOSIS — R42 Dizziness and giddiness: Secondary | ICD-10-CM | POA: Diagnosis not present

## 2022-05-25 DIAGNOSIS — F17210 Nicotine dependence, cigarettes, uncomplicated: Secondary | ICD-10-CM | POA: Diagnosis not present

## 2022-05-25 DIAGNOSIS — D75839 Thrombocytosis, unspecified: Secondary | ICD-10-CM | POA: Diagnosis not present

## 2022-05-25 DIAGNOSIS — I7090 Unspecified atherosclerosis: Secondary | ICD-10-CM | POA: Diagnosis not present

## 2022-05-25 DIAGNOSIS — I509 Heart failure, unspecified: Secondary | ICD-10-CM | POA: Diagnosis not present

## 2022-05-25 DIAGNOSIS — D320 Benign neoplasm of cerebral meninges: Secondary | ICD-10-CM | POA: Diagnosis not present

## 2022-05-25 DIAGNOSIS — M810 Age-related osteoporosis without current pathological fracture: Secondary | ICD-10-CM | POA: Diagnosis not present

## 2022-05-25 DIAGNOSIS — I517 Cardiomegaly: Secondary | ICD-10-CM | POA: Diagnosis not present

## 2022-05-25 DIAGNOSIS — Z9842 Cataract extraction status, left eye: Secondary | ICD-10-CM | POA: Diagnosis not present

## 2022-05-25 DIAGNOSIS — Z79899 Other long term (current) drug therapy: Secondary | ICD-10-CM | POA: Diagnosis not present

## 2022-05-25 DIAGNOSIS — J449 Chronic obstructive pulmonary disease, unspecified: Secondary | ICD-10-CM | POA: Diagnosis not present

## 2022-05-25 DIAGNOSIS — I493 Ventricular premature depolarization: Secondary | ICD-10-CM | POA: Diagnosis not present

## 2022-05-25 DIAGNOSIS — M1990 Unspecified osteoarthritis, unspecified site: Secondary | ICD-10-CM | POA: Diagnosis not present

## 2022-05-25 DIAGNOSIS — K760 Fatty (change of) liver, not elsewhere classified: Secondary | ICD-10-CM | POA: Diagnosis not present

## 2022-05-25 DIAGNOSIS — Z66 Do not resuscitate: Secondary | ICD-10-CM | POA: Diagnosis not present

## 2022-05-25 DIAGNOSIS — G9389 Other specified disorders of brain: Secondary | ICD-10-CM | POA: Diagnosis not present

## 2022-05-25 DIAGNOSIS — I083 Combined rheumatic disorders of mitral, aortic and tricuspid valves: Secondary | ICD-10-CM | POA: Diagnosis not present

## 2022-05-25 DIAGNOSIS — J439 Emphysema, unspecified: Secondary | ICD-10-CM | POA: Diagnosis not present

## 2022-05-25 DIAGNOSIS — M7989 Other specified soft tissue disorders: Secondary | ICD-10-CM | POA: Diagnosis not present

## 2022-05-25 DIAGNOSIS — J90 Pleural effusion, not elsewhere classified: Secondary | ICD-10-CM | POA: Diagnosis not present

## 2022-05-25 DIAGNOSIS — E876 Hypokalemia: Secondary | ICD-10-CM | POA: Diagnosis not present

## 2022-05-25 DIAGNOSIS — R918 Other nonspecific abnormal finding of lung field: Secondary | ICD-10-CM | POA: Diagnosis not present

## 2022-05-25 DIAGNOSIS — I110 Hypertensive heart disease with heart failure: Secondary | ICD-10-CM | POA: Diagnosis not present

## 2022-05-25 DIAGNOSIS — D509 Iron deficiency anemia, unspecified: Secondary | ICD-10-CM | POA: Diagnosis not present

## 2022-05-25 DIAGNOSIS — Z681 Body mass index (BMI) 19 or less, adult: Secondary | ICD-10-CM | POA: Diagnosis not present

## 2022-05-25 DIAGNOSIS — Z888 Allergy status to other drugs, medicaments and biological substances status: Secondary | ICD-10-CM | POA: Diagnosis not present

## 2022-05-25 DIAGNOSIS — J440 Chronic obstructive pulmonary disease with acute lower respiratory infection: Secondary | ICD-10-CM | POA: Diagnosis not present

## 2022-05-25 DIAGNOSIS — I5033 Acute on chronic diastolic (congestive) heart failure: Secondary | ICD-10-CM | POA: Diagnosis not present

## 2022-05-25 DIAGNOSIS — Z9841 Cataract extraction status, right eye: Secondary | ICD-10-CM | POA: Diagnosis not present

## 2022-05-25 DIAGNOSIS — E440 Moderate protein-calorie malnutrition: Secondary | ICD-10-CM | POA: Diagnosis not present

## 2022-05-25 DIAGNOSIS — I10 Essential (primary) hypertension: Secondary | ICD-10-CM | POA: Diagnosis not present

## 2022-05-25 DIAGNOSIS — I50813 Acute on chronic right heart failure: Secondary | ICD-10-CM | POA: Diagnosis not present

## 2022-05-25 DIAGNOSIS — R911 Solitary pulmonary nodule: Secondary | ICD-10-CM | POA: Diagnosis not present
